# Patient Record
Sex: Male | Born: 1970 | Race: White | Hispanic: No | Marital: Single | Smoking: Never smoker
Health system: Southern US, Community
[De-identification: ages and names within clinical notes are randomized; demographics above are authoritative.]

## PROBLEM LIST (undated history)

## (undated) DIAGNOSIS — T887XXA Unspecified adverse effect of drug or medicament, initial encounter: Secondary | ICD-10-CM

## (undated) DIAGNOSIS — F419 Anxiety disorder, unspecified: Secondary | ICD-10-CM

## (undated) DIAGNOSIS — Z0000 Encounter for general adult medical examination without abnormal findings: Secondary | ICD-10-CM

## (undated) DIAGNOSIS — E669 Obesity, unspecified: Secondary | ICD-10-CM

## (undated) DIAGNOSIS — Z789 Other specified health status: Secondary | ICD-10-CM

## (undated) DIAGNOSIS — R079 Chest pain, unspecified: Secondary | ICD-10-CM

## (undated) DIAGNOSIS — K1370 Unspecified lesions of oral mucosa: Secondary | ICD-10-CM

## (undated) DIAGNOSIS — F32A Depression, unspecified: Secondary | ICD-10-CM

## (undated) DIAGNOSIS — G4700 Insomnia, unspecified: Secondary | ICD-10-CM

## (undated) DIAGNOSIS — T7840XA Allergy, unspecified, initial encounter: Secondary | ICD-10-CM

## (undated) HISTORY — DX: Obesity, unspecified: E66.9

## (undated) HISTORY — DX: Unspecified lesions of oral mucosa: K13.70

## (undated) HISTORY — DX: Unspecified adverse effect of drug or medicament, initial encounter: T88.7XXA

## (undated) HISTORY — DX: Allergy, unspecified, initial encounter: T78.40XA

## (undated) HISTORY — DX: Anxiety disorder, unspecified: F41.9

## (undated) HISTORY — PX: WISDOM TOOTH EXTRACTION: SHX21

## (undated) HISTORY — DX: Encounter for general adult medical examination without abnormal findings: Z00.00

## (undated) HISTORY — DX: Chest pain, unspecified: R07.9

## (undated) HISTORY — DX: Other specified health status: Z78.9

## (undated) HISTORY — DX: Insomnia, unspecified: G47.00

---

## 2012-03-12 ENCOUNTER — Encounter: Payer: Self-pay | Admitting: Family Medicine

## 2012-03-12 ENCOUNTER — Ambulatory Visit (INDEPENDENT_AMBULATORY_CARE_PROVIDER_SITE_OTHER): Payer: BC Managed Care – PPO | Admitting: Family Medicine

## 2012-03-12 VITALS — BP 137/78 | HR 73 | Temp 98.4°F | Ht 72.0 in | Wt 210.6 lb

## 2012-03-12 DIAGNOSIS — Z720 Tobacco use: Secondary | ICD-10-CM | POA: Insufficient documentation

## 2012-03-12 DIAGNOSIS — K1370 Unspecified lesions of oral mucosa: Secondary | ICD-10-CM

## 2012-03-12 DIAGNOSIS — F17200 Nicotine dependence, unspecified, uncomplicated: Secondary | ICD-10-CM

## 2012-03-12 HISTORY — DX: Unspecified lesions of oral mucosa: K13.70

## 2012-03-12 HISTORY — DX: Tobacco use: Z72.0

## 2012-03-12 MED ORDER — NICOTINE POLACRILEX 4 MG MT GUM: 4.0000 mg | CHEWING_GUM | OROMUCOSAL | Status: AC | PRN

## 2012-03-12 NOTE — Progress Notes (Signed)
  Subjective:    Patient ID: Kyle Nolan, male    DOB: 1970-12-15, 41 y.o.   MRN: [id number]  HPI New to establish.  Previous MD- none.  Snuff use- using x22 yrs, used this to quit smoking.  Using a can/day.  Has lesion in the mouth.  Has not seen dentist.  L cheek wall, will occasionally bleed.  This is side that he puts his dip.  Very anxious.  Fearful of mouth cancer.   Review of Systems For ROS see HPI     Objective:   Physical Exam  Vitals reviewed. Constitutional: He appears well-developed and well-nourished. No distress.  HENT:  Head: Normocephalic and atraumatic.  Mouth/Throat: Oropharynx is clear and moist. No oropharyngeal exudate.       Area of abnormality on L buccal mucosa at level of bite line.  No exudate or bleeding today  Psychiatric:       Very anxious Bouncing leg throughout visit          Assessment & Plan:

## 2012-03-12 NOTE — Patient Instructions (Addendum)
Schedule your complete physical at your convenience We'll call you with your ENT appt Chew gum, candy, use lollipops, etc to avoid dipping Use the Nicotine gum if you find yourself needing the Nicotine Call with any questions or concerns Hang in there!

## 2012-03-13 NOTE — Assessment & Plan Note (Signed)
New.  Discussed that this may be due to grinding his teeth on that side and getting cheek caught but due to hx of snuff use will need ENT exam and possible bx.  Pt aware and in agreement.  Will follow.

## 2012-03-13 NOTE — Assessment & Plan Note (Signed)
New.  Pt interested in quitting.  Discussed nicotine replacement vs nicotine receptor blockers.  Pt not interested in systemic tx.  Will do nicorette gum if finds himself going through nicotine w/drawal.  Will otherwise curb his oral fixation w/ lollipops, candy, gum.  Stressed the importance of this.  Pt aware.  Will follow.

## 2012-05-14 ENCOUNTER — Encounter: Payer: BC Managed Care – PPO | Admitting: Family Medicine

## 2013-06-20 ENCOUNTER — Encounter: Payer: Self-pay | Admitting: Family Medicine

## 2013-06-20 ENCOUNTER — Ambulatory Visit (INDEPENDENT_AMBULATORY_CARE_PROVIDER_SITE_OTHER): Payer: BC Managed Care – PPO | Admitting: Family Medicine

## 2013-06-20 VITALS — BP 118/80 | HR 78 | Temp 98.6°F | Ht 71.0 in | Wt 206.6 lb

## 2013-06-20 DIAGNOSIS — F419 Anxiety disorder, unspecified: Secondary | ICD-10-CM | POA: Insufficient documentation

## 2013-06-20 DIAGNOSIS — G4700 Insomnia, unspecified: Secondary | ICD-10-CM

## 2013-06-20 DIAGNOSIS — F341 Dysthymic disorder: Secondary | ICD-10-CM

## 2013-06-20 DIAGNOSIS — R079 Chest pain, unspecified: Secondary | ICD-10-CM | POA: Insufficient documentation

## 2013-06-20 DIAGNOSIS — F329 Major depressive disorder, single episode, unspecified: Secondary | ICD-10-CM

## 2013-06-20 HISTORY — DX: Insomnia, unspecified: G47.00

## 2013-06-20 HISTORY — DX: Anxiety disorder, unspecified: F41.9

## 2013-06-20 HISTORY — DX: Chest pain, unspecified: R07.9

## 2013-06-20 MED ORDER — TRAZODONE HCL 50 MG PO TABS: 25.0000 mg | ORAL_TABLET | Freq: Every evening | ORAL | Status: DC | PRN

## 2013-06-20 NOTE — Progress Notes (Signed)
  Subjective:    Patient ID: Kyle Nolan, male    DOB: 1970-11-08, 42 y.o.   MRN: [id number]  HPI Hospital f/u- was admitted to [facility] on 9/19 w/ CP.  Had cardiac workup and was ruled out.  Had normal MRI of brain, normal stress ECHO, normal carotid dopplers.  Pt reports increased personal stress- ex is attempting to see daughter and daughter (98 yo) attempted suicide b/c she doesn't want to see mom.  Not sleeping x6 weeks.  Pt feels his stress level contributed to his sxs- CP radiating to R shoulder, SOB.  Pain was worse w/ movement.  Now in counseling w/ daughter.  Working w/ a Clinical research associate to ensure no contact w/ mom unless initiated by daughter.   Review of Systems For ROS see HPI     Objective:   Physical Exam  Vitals reviewed. Constitutional: He is oriented to person, place, and time. He appears well-developed and well-nourished. No distress.  HENT:  Head: Normocephalic and atraumatic.  Eyes: Conjunctivae and EOM are normal. Pupils are equal, round, and reactive to light.  Neck: Normal range of motion. Neck supple. No thyromegaly present.  Cardiovascular: Normal rate, regular rhythm, normal heart sounds and intact distal pulses.   No murmur heard. Pulmonary/Chest: Effort normal and breath sounds normal. No respiratory distress.  Abdominal: Soft. Bowel sounds are normal. He exhibits no distension.  Musculoskeletal: He exhibits no edema.  Lymphadenopathy:    He has no cervical adenopathy.  Neurological: He is alert and oriented to person, place, and time. No cranial nerve deficit.  Skin: Skin is warm and dry.  Psychiatric: He has a normal mood and affect. His behavior is normal.          Assessment & Plan:

## 2013-06-20 NOTE — Patient Instructions (Addendum)
Follow up in 4-6 weeks to recheck sleep/mood Start the Trazodone nightly- start w/ 1/2 tab and increase to full tab if needed I'm proud of you for the counseling- this will help! If you reconsider meds, let me know! Call with any questions or concerns Hang in there! Happy Early [place] Ouch!!!

## 2013-06-23 NOTE — Assessment & Plan Note (Addendum)
New.  Pt had complete cardiac w/u and was ruled out for any cardiac events- reviewed hospital record from [facility].  Felt to be stress related.  Reviewed supportive care and red flags that should prompt return.  Pt expressed understanding and is in agreement w/ plan.

## 2013-06-23 NOTE — Assessment & Plan Note (Signed)
New.  Start trazodone and see if improved sleep helps to improve pt's current anxiety/depression.  Reviewed supportive care and red flags that should prompt return.  Pt expressed understanding and is in agreement w/ plan.

## 2013-06-23 NOTE — Assessment & Plan Note (Signed)
New.  Pt admits this is a large problem right now.  Not interested in SSRI at this time but will ask for help if needed.  Will follow closely.

## 2013-07-22 ENCOUNTER — Ambulatory Visit (INDEPENDENT_AMBULATORY_CARE_PROVIDER_SITE_OTHER): Payer: BC Managed Care – PPO | Admitting: Family Medicine

## 2013-07-22 ENCOUNTER — Encounter: Payer: Self-pay | Admitting: Family Medicine

## 2013-07-22 VITALS — BP 130/82 | HR 65 | Temp 98.6°F | Resp 16 | Wt 203.1 lb

## 2013-07-22 DIAGNOSIS — T887XXA Unspecified adverse effect of drug or medicament, initial encounter: Secondary | ICD-10-CM

## 2013-07-22 DIAGNOSIS — F329 Major depressive disorder, single episode, unspecified: Secondary | ICD-10-CM

## 2013-07-22 DIAGNOSIS — G4700 Insomnia, unspecified: Secondary | ICD-10-CM

## 2013-07-22 DIAGNOSIS — T50905A Adverse effect of unspecified drugs, medicaments and biological substances, initial encounter: Secondary | ICD-10-CM

## 2013-07-22 DIAGNOSIS — F341 Dysthymic disorder: Secondary | ICD-10-CM

## 2013-07-22 HISTORY — DX: Unspecified adverse effect of drug or medicament, initial encounter: T88.7XXA

## 2013-07-22 NOTE — Assessment & Plan Note (Signed)
New.  Pt is now having ED from the Trazodone.  Will decrease to 1/2 tab and see if sxs improve.  Pt to call if no improvement.

## 2013-07-22 NOTE — Assessment & Plan Note (Signed)
Improved since starting trazodone.  Decrease to 1/2 tab to see if sleep is still managed but decreased side effects.  Will follow.

## 2013-07-22 NOTE — Progress Notes (Signed)
  Subjective:    Patient ID: Kyle Nolan, male    DOB: Jul 27, 1971, 42 y.o.   MRN: [id number]  HPI Insomnia- improved since starting the trazodone.  Now sleeping 5-5.5 hrs straight.  Medication is causing ED if taken a few hrs prior to activity.  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  Depression- sxs are improving as pt's daughter improves.  No additional suicide attempts, he has come to an agreement w/ his ex-wife.   Review of Systems For ROS see HPI     Objective:   Physical Exam  Vitals reviewed. Constitutional: He is oriented to person, place, and time. He appears well-developed and well-nourished. No distress.  Neurological: He is alert and oriented to person, place, and time.  Psychiatric: He has a normal mood and affect. His behavior is normal. Judgment and thought content normal.          Assessment & Plan:

## 2013-07-22 NOTE — Assessment & Plan Note (Signed)
Improving as pt's daughter's situation/health improves.  Improved sleep has also helped.  Not interested in meds at this time.  Will continue to follow.

## 2013-07-22 NOTE — Patient Instructions (Signed)
Follow up as needed Decrease to 1/2 tab of trazodone nightly If still having side effects, call me! So glad things are improving Happy Halloween!!!

## 2013-10-04 ENCOUNTER — Telehealth: Payer: Self-pay

## 2013-10-04 NOTE — Telephone Encounter (Signed)
Unable to reach. Taken off wait list to replace cancelled appt

## 2013-10-09 ENCOUNTER — Encounter: Payer: Self-pay | Admitting: Family Medicine

## 2013-10-14 ENCOUNTER — Encounter: Payer: BC Managed Care – PPO | Admitting: Family Medicine

## 2013-12-13 ENCOUNTER — Telehealth: Payer: Self-pay

## 2013-12-13 NOTE — Telephone Encounter (Signed)
Left message for call back Non-identifiable   Flu-declined Tdap-

## 2013-12-16 ENCOUNTER — Ambulatory Visit (INDEPENDENT_AMBULATORY_CARE_PROVIDER_SITE_OTHER): Payer: BC Managed Care – PPO | Admitting: Family Medicine

## 2013-12-16 ENCOUNTER — Encounter: Payer: Self-pay | Admitting: Family Medicine

## 2013-12-16 VITALS — BP 116/78 | HR 76 | Temp 98.3°F | Resp 16 | Ht 72.0 in | Wt 216.1 lb

## 2013-12-16 DIAGNOSIS — Z0000 Encounter for general adult medical examination without abnormal findings: Secondary | ICD-10-CM

## 2013-12-16 HISTORY — DX: Encounter for general adult medical examination without abnormal findings: Z00.00

## 2013-12-16 NOTE — Patient Instructions (Signed)
Follow up in 1 year or as needed Keep up the good work! STOP dipping! We'll notify you of your lab results and make any changes if needed Call with any questions or concerns Happy Spring!!!

## 2013-12-16 NOTE — Progress Notes (Signed)
Pre visit review using our clinic review tool, if applicable. No additional management support is needed unless otherwise documented below in the visit note. 

## 2013-12-16 NOTE — Progress Notes (Signed)
   Subjective:    Patient ID: Kyle Nolan, male    DOB: 02-Oct-1970, 43 y.o.   MRN: [id number]  HPI CPE- no concerns.   Review of Systems Patient reports no vision/hearing changes, anorexia, fever ,adenopathy, persistant/recurrent hoarseness, swallowing issues, chest pain, palpitations, edema, persistant/recurrent cough, hemoptysis, dyspnea (rest,exertional, paroxysmal nocturnal), gastrointestinal  bleeding (melena, rectal bleeding), abdominal pain, excessive heart burn, GU symptoms (dysuria, hematuria, voiding/incontinence issues) syncope, focal weakness, memory loss, numbness & tingling, skin/hair/nail changes, depression, anxiety, abnormal bruising/bleeding, musculoskeletal symptoms/signs.     Objective:   Physical Exam General Appearance:    Alert, cooperative, no distress, appears stated age  Head:    Normocephalic, without obvious abnormality, atraumatic  Eyes:    PERRL, conjunctiva/corneas clear, EOM's intact, fundi    benign, both eyes       Ears:    Normal TM's and external ear canals, both ears  Nose:   Nares normal, septum midline, mucosa normal, no drainage   or sinus tenderness  Throat:   Lips, mucosa, and tongue normal; teeth and gums normal  Neck:   Supple, symmetrical, trachea midline, no adenopathy;       thyroid:  No enlargement/tenderness/nodules  Back:     Symmetric, no curvature, ROM normal, no CVA tenderness  Lungs:     Clear to auscultation bilaterally, respirations unlabored  Chest wall:    No tenderness or deformity  Heart:    Regular rate and rhythm, S1 and S2 normal, no murmur, rub   or gallop  Abdomen:     Soft, non-tender, bowel sounds active all four quadrants,    no masses, no organomegaly  Genitalia:    Normal male without lesion, masses,discharge or tenderness  Rectal:    Deferred due to young age  Extremities:   Extremities normal, atraumatic, no cyanosis or edema  Pulses:   2+ and symmetric all extremities  Skin:   Skin color, texture, turgor  normal, no rashes or lesions  Lymph nodes:   Cervical, supraclavicular, and axillary nodes normal  Neurologic:   CNII-XII intact. Normal strength, sensation and reflexes      throughout          Assessment & Plan:

## 2013-12-16 NOTE — Assessment & Plan Note (Signed)
Pt's PE WNL.  Check labs.  Anticipatory guidance provided.  

## 2013-12-17 LAB — CBC WITH DIFFERENTIAL/PLATELET
BASOS ABS: 0 10*3/uL (ref 0.0–0.1)
Basophils Relative: 0.4 % (ref 0.0–3.0)
EOS ABS: 0.1 10*3/uL (ref 0.0–0.7)
Eosinophils Relative: 1.2 % (ref 0.0–5.0)
HCT: 44.5 % (ref 39.0–52.0)
Hemoglobin: 15.2 g/dL (ref 13.0–17.0)
LYMPHS PCT: 18 % (ref 12.0–46.0)
Lymphs Abs: 1.8 10*3/uL (ref 0.7–4.0)
MCHC: 34.1 g/dL (ref 30.0–36.0)
MCV: 92.3 fl (ref 78.0–100.0)
Monocytes Absolute: 0.5 10*3/uL (ref 0.1–1.0)
Monocytes Relative: 4.6 % (ref 3.0–12.0)
NEUTROS PCT: 75.8 % (ref 43.0–77.0)
Neutro Abs: 7.6 10*3/uL (ref 1.4–7.7)
PLATELETS: 272 10*3/uL (ref 150.0–400.0)
RBC: 4.82 Mil/uL (ref 4.22–5.81)
RDW: 12.8 % (ref 11.5–14.6)
WBC: 10.1 10*3/uL (ref 4.5–10.5)

## 2013-12-17 LAB — LIPID PANEL
CHOLESTEROL: 246 mg/dL — AB (ref 0–200)
HDL: 37.7 mg/dL — AB (ref >39.00)
LDL Cholesterol: 142 mg/dL — ABNORMAL HIGH (ref 0–99)
Total CHOL/HDL Ratio: 7
Triglycerides: 330 mg/dL — ABNORMAL HIGH (ref 0.0–149.0)
VLDL: 66 mg/dL — AB (ref 0.0–40.0)

## 2013-12-17 LAB — BASIC METABOLIC PANEL
BUN: 15 mg/dL (ref 6–23)
CO2: 27 meq/L (ref 19–32)
Calcium: 9 mg/dL (ref 8.4–10.5)
Chloride: 103 mEq/L (ref 96–112)
Creatinine, Ser: 1.2 mg/dL (ref 0.4–1.5)
GFR: 72.54 mL/min (ref >60.00)
GLUCOSE: 98 mg/dL (ref 70–99)
POTASSIUM: 3.8 meq/L (ref 3.5–5.1)
Sodium: 138 mEq/L (ref 135–145)

## 2013-12-17 LAB — HEPATIC FUNCTION PANEL
ALBUMIN: 4.3 g/dL (ref 3.5–5.2)
ALK PHOS: 32 U/L — AB (ref 39–117)
ALT: 26 U/L (ref 0–53)
AST: 18 U/L (ref 0–37)
Bilirubin, Direct: 0 mg/dL (ref 0.0–0.3)
Total Bilirubin: 0.7 mg/dL (ref 0.3–1.2)
Total Protein: 7 g/dL (ref 6.0–8.3)

## 2013-12-17 LAB — TSH: TSH: 1.09 u[IU]/mL (ref 0.35–5.50)

## 2013-12-18 ENCOUNTER — Other Ambulatory Visit: Payer: Self-pay | Admitting: Family Medicine

## 2013-12-18 ENCOUNTER — Encounter: Payer: Self-pay | Admitting: General Practice

## 2013-12-18 DIAGNOSIS — E782 Mixed hyperlipidemia: Secondary | ICD-10-CM

## 2013-12-18 NOTE — Telephone Encounter (Signed)
Unable to reach patient pre visit.  

## 2014-03-03 ENCOUNTER — Ambulatory Visit: Payer: Self-pay

## 2014-03-03 ENCOUNTER — Other Ambulatory Visit: Payer: Self-pay | Admitting: Occupational Medicine

## 2014-03-03 DIAGNOSIS — M25511 Pain in right shoulder: Secondary | ICD-10-CM

## 2014-12-22 ENCOUNTER — Encounter: Payer: Self-pay | Admitting: Family Medicine

## 2015-02-16 ENCOUNTER — Encounter: Payer: Self-pay | Admitting: Family Medicine

## 2015-02-16 DIAGNOSIS — Z0289 Encounter for other administrative examinations: Secondary | ICD-10-CM

## 2015-02-26 ENCOUNTER — Encounter: Payer: Self-pay | Admitting: Family Medicine

## 2015-02-26 ENCOUNTER — Telehealth: Payer: Self-pay | Admitting: Family Medicine

## 2015-02-26 NOTE — Telephone Encounter (Signed)
Pt was no show for cpe on 5/23. Letter sent- charge?

## 2015-02-26 NOTE — Telephone Encounter (Signed)
Yes- charge please

## 2015-09-16 IMAGING — CR DG SHOULDER 2+V*R*
3 series · 3 of 3 positions shown · non-contrast
Comparison: None.

CLINICAL DATA: 42-year-old male with lifting injury and persistent
pain with decreased range of motion. Initial encounter.

EXAM:
RIGHT SHOULDER - 2+ VIEW

[view not recorded (1 of 3)]
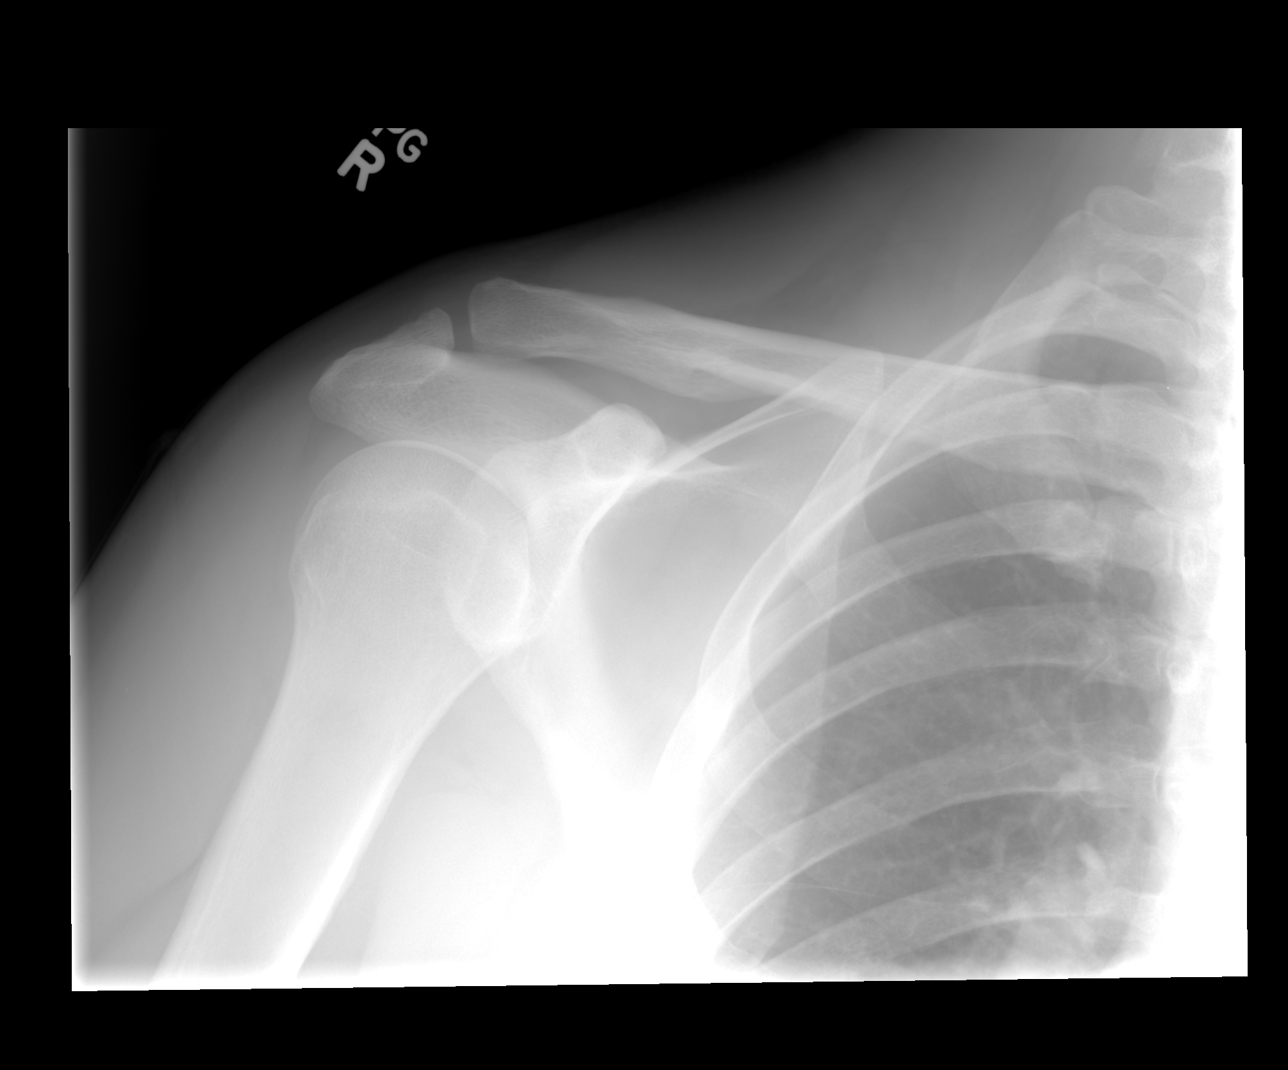

[view not recorded (2 of 3)]
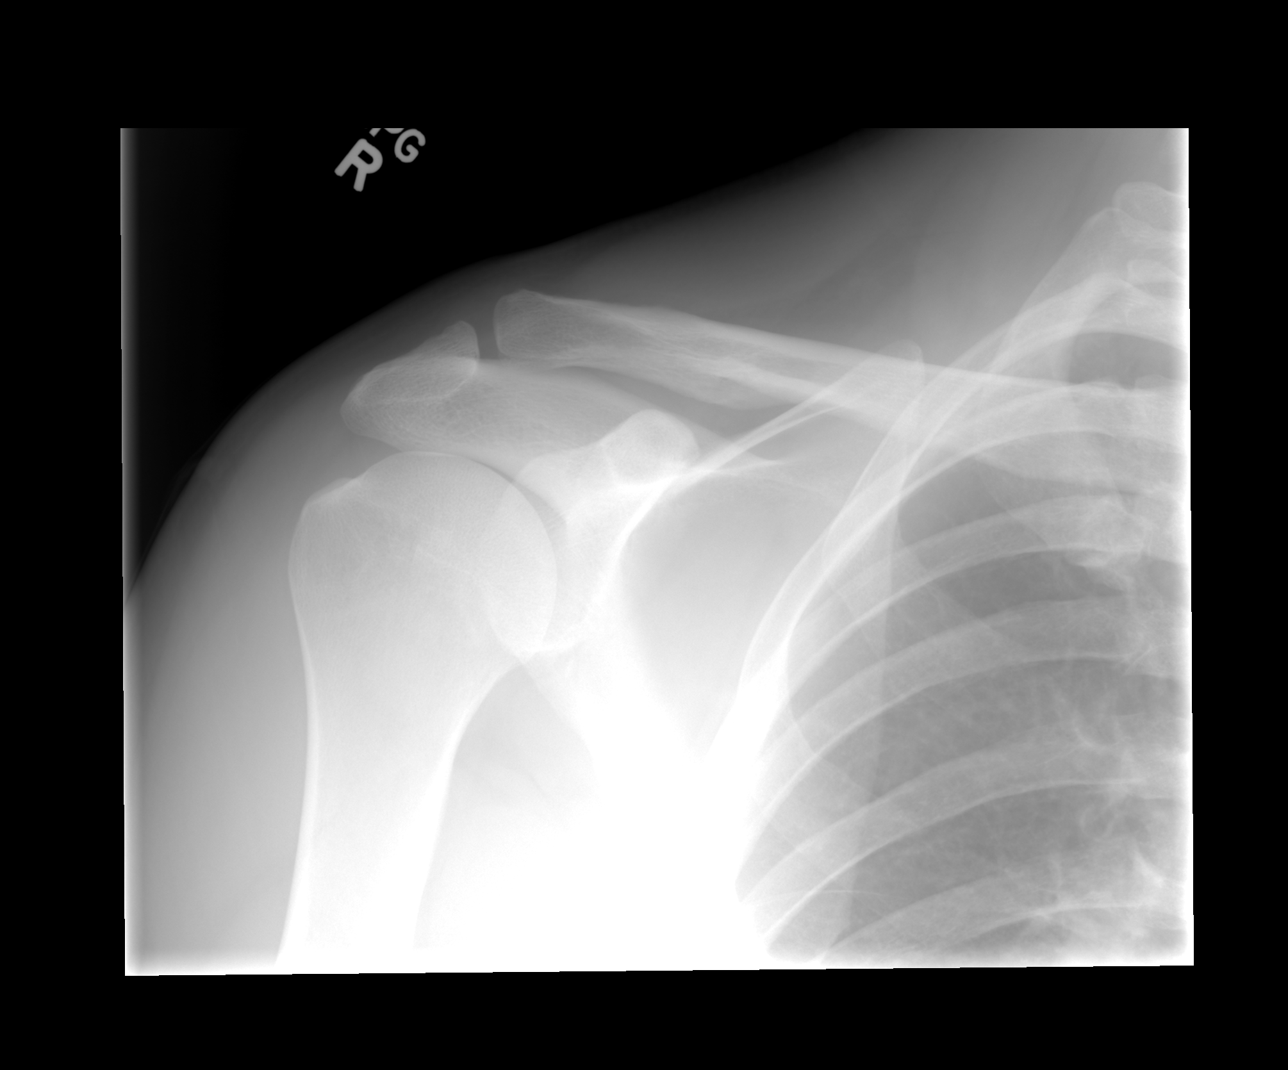

[view not recorded (3 of 3)]
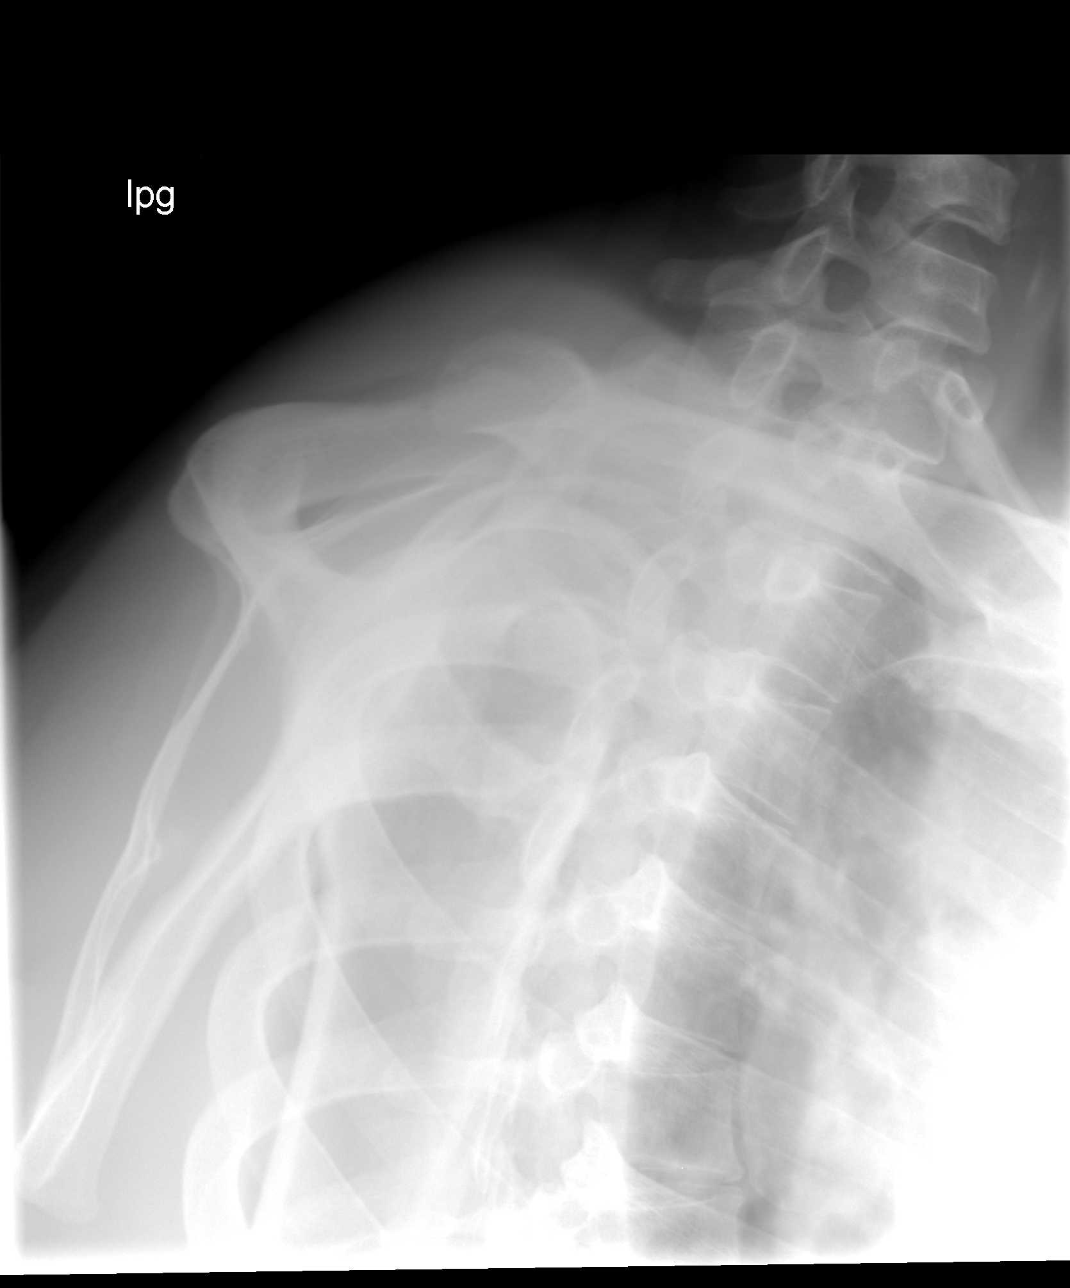

[3 of 3 positions shown; findings below may reference images not displayed]

FINDINGS: Bone mineralization is within normal limits. Preserved proximal
humerus rotation arguing against glenohumeral joint dislocation. The
scapular Y-view positioning is suboptimal. Proximal right humerus
appears intact. Right clavicle and scapula appear intact. Negative
visualized right ribs and lung parenchyma.
IMPRESSION: No acute fracture or dislocation identified about the right
shoulder.

## 2018-02-15 ENCOUNTER — Emergency Department (HOSPITAL_COMMUNITY): Payer: Commercial Managed Care - PPO

## 2018-02-15 ENCOUNTER — Encounter (HOSPITAL_COMMUNITY): Payer: Self-pay | Admitting: Emergency Medicine

## 2018-02-15 ENCOUNTER — Emergency Department (HOSPITAL_COMMUNITY)
Admission: EM | Admit: 2018-02-15 | Discharge: 2018-02-15 | Disposition: A | Discharge status: Home / Self Care | Payer: Commercial Managed Care - PPO | Attending: Emergency Medicine | Admitting: Emergency Medicine

## 2018-02-15 DIAGNOSIS — R079 Chest pain, unspecified: Secondary | ICD-10-CM | POA: Diagnosis not present

## 2018-02-15 DIAGNOSIS — Z79899 Other long term (current) drug therapy: Secondary | ICD-10-CM | POA: Diagnosis not present

## 2018-02-15 DIAGNOSIS — R609 Edema, unspecified: Secondary | ICD-10-CM | POA: Insufficient documentation

## 2018-02-15 DIAGNOSIS — R0789 Other chest pain: Secondary | ICD-10-CM | POA: Diagnosis present

## 2018-02-15 DIAGNOSIS — R0602 Shortness of breath: Secondary | ICD-10-CM | POA: Diagnosis not present

## 2018-02-15 LAB — CBC
HCT: 42.8 % (ref 39.0–52.0)
Hemoglobin: 14.3 g/dL (ref 13.0–17.0)
MCH: 30.2 pg (ref 26.0–34.0)
MCHC: 33.4 g/dL (ref 30.0–36.0)
MCV: 90.5 fL (ref 78.0–100.0)
PLATELETS: 252 10*3/uL (ref 150–400)
RBC: 4.73 MIL/uL (ref 4.22–5.81)
RDW: 12.3 % (ref 11.5–15.5)
WBC: 8.2 10*3/uL (ref 4.0–10.5)

## 2018-02-15 LAB — BASIC METABOLIC PANEL
Anion gap: 8 (ref 5–15)
BUN: 15 mg/dL (ref 6–20)
CO2: 27 mmol/L (ref 22–32)
CREATININE: 1.14 mg/dL (ref 0.61–1.24)
Calcium: 9 mg/dL (ref 8.9–10.3)
Chloride: 105 mmol/L (ref 101–111)
GFR calc Af Amer: >60 mL/min (ref >60)
Glucose, Bld: 111 mg/dL — ABNORMAL HIGH (ref 65–99)
Potassium: 4.5 mmol/L (ref 3.5–5.1)
SODIUM: 140 mmol/L (ref 135–145)

## 2018-02-15 LAB — D-DIMER, QUANTITATIVE: D-Dimer, Quant: <0.27 ug/mL-FEU (ref 0.00–0.50)

## 2018-02-15 LAB — I-STAT TROPONIN, ED
TROPONIN I, POC: 0 ng/mL (ref 0.00–0.08)
TROPONIN I, POC: 0 ng/mL (ref 0.00–0.08)
Troponin i, poc: 0 ng/mL (ref 0.00–0.08)

## 2018-02-15 NOTE — ED Triage Notes (Signed)
Patient to ED c/o intermittent L sided CP x 3 days with SOB and worsening pain upon inspiration. 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 States the pain has lasted as long as 15 minutes at a time and is occurring more frequently and upon rest and exertion. Denies pain at this time. Resp e/u, skin warm/dry.

## 2018-02-15 NOTE — ED Provider Notes (Signed)
[facility] EMERGENCY DEPARTMENT Provider Note   CSN: [id number] Arrival date & time: 02/15/18  6433     History   Chief Complaint Chief Complaint  Patient presents with  . Chest Pain    HPI Kyle Nolan is a 47 y.o. male here presenting with chest pain.  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  Patient states that he has been having intermittent left-sided chest pain and shortness of breath that is worse with inspiration lasted several minutes at a time.  Patient states that the stabbing sensation but denies any radiation to the pain.  Patient noticed some swelling on bilateral ankles but denies any calf pain.  Patient denies any previous blood clots and denies any history of CAD.   The history is provided by the patient.    Past Medical History:  Diagnosis Date  . Allergy     Patient Active Problem List   Diagnosis Date Noted  . Routine general medical examination at a health care facility 12/16/2013  . Medication side effect 07/22/2013  . Anxiety and depression 06/20/2013  . Chest pain, unspecified 06/20/2013  . Insomnia 06/20/2013  . Snuff user 03/12/2012  . Mouth lesion 03/12/2012    History reviewed. No pertinent surgical history.      Home Medications    Prior to Admission medications   Medication Sig Start Date End Date Taking? Authorizing Provider  ibuprofen (ADVIL,MOTRIN) 200 MG tablet Take 800 mg by mouth every 6 (six) hours as needed for mild pain.   Yes [provider]  levocetirizine (XYZAL) 5 MG tablet Take 5 mg by mouth every evening.   Yes [provider]  traZODone (DESYREL) 50 MG tablet Take 0.5-1 tablets (25-50 mg total) by mouth at bedtime as needed for sleep. Patient not taking: Reported on 02/15/2018 06/20/13   Midge Minium, MD    Family History Family History  Problem Relation Age of Onset  . Hyperlipidemia Mother   . Hypertension Mother   . Hyperlipidemia Father   .  Hypertension Father   . Depression Father   . OCD Father   . Cancer Maternal Uncle   . Hyperlipidemia Maternal Grandmother   . Hypertension Maternal Grandmother   . Cancer Maternal Grandfather   . Hyperlipidemia Maternal Grandfather   . Hypertension Maternal Grandfather   . Cancer Paternal Grandmother   . Hyperlipidemia Paternal Grandmother   . Hypertension Paternal Grandmother   . Cancer Paternal Grandfather   . Hyperlipidemia Paternal Grandfather   . Hypertension Paternal Grandfather   . Diabetes Paternal Grandfather     Social History Social History   Tobacco Use  . Smoking status: Never Smoker  . Smokeless tobacco: Never Used  Substance Use Topics  . Alcohol use: Yes    Comment: occasionally  . Drug use: No     Allergies   Patient has no known allergies.   Review of Systems Review of Systems  Cardiovascular: Positive for chest pain.  All other systems reviewed and are negative.    Physical Exam Updated Vital Signs BP (!) 124/91   Pulse (!) 57   Temp 98 F (36.7 C) (Oral)   Resp 15   SpO2 97%   Physical Exam  Constitutional: He appears well-developed and well-nourished.  HENT:  Head: Normocephalic.  Eyes: Pupils are equal, round, and reactive to light.  Neck: Normal range of motion.  Cardiovascular: Regular rhythm and normal pulses.  Pulmonary/Chest: Effort normal and breath sounds  normal.  No reproducible tenderness   Abdominal: Soft. Bowel sounds are normal.  Musculoskeletal: Normal range of motion.       Right lower leg: Normal.       Left lower leg: Normal.  Trace edema bilaterally, no calf tenderness   Neurological: He is alert.  Skin: Skin is warm. Capillary refill takes less than 2 seconds.  Psychiatric: He has a normal mood and affect. His behavior is normal.  Nursing note and vitals reviewed.    ED Treatments / Results  Labs (all labs ordered are listed, but only abnormal results are displayed) Labs Reviewed  BASIC METABOLIC  PANEL - Abnormal; Notable for the following components:      Result Value   Glucose, Bld 111 (*)    All other components within normal limits  CBC  D-DIMER, QUANTITATIVE (NOT AT [facility])  I-STAT TROPONIN, ED  I-STAT TROPONIN, ED  I-STAT TROPONIN, ED    EKG EKG Interpretation  Date/Time:  Thursday Feb 15 2018 09:17:35 EDT Ventricular Rate:  63 PR Interval:  142 QRS Duration: 88 QT Interval:  368 QTC Calculation: 376 R Axis:   43 Text Interpretation:  Normal sinus rhythm Normal ECG No previous ECGs available Confirmed by Wandra Arthurs [phone]) on 02/15/2018 12:12:03 PM   Radiology Dg Chest 2 View  Result Date: 02/15/2018 CLINICAL DATA:  Left-sided chest pain intermittently over the last 3-4 days, some shortness of breath EXAM: CHEST - 2 VIEW COMPARISON:  None. FINDINGS: No active infiltrate or effusion is seen. Mediastinal and hilar contours are unremarkable. The heart is within normal limits in size. No bony abnormality is seen. IMPRESSION: No active cardiopulmonary disease. Electronically Signed   By: Ivar Drape M.D.   On: 02/15/2018 09:45    Procedures Procedures (including critical care time)  Medications Ordered in ED Medications - No data to display   Initial Impression / Assessment and Plan / ED Course  I have reviewed the triage vital signs and the nursing notes.  Pertinent labs & imaging results that were available during my care of the patient were reviewed by me and considered in my medical decision making (see chart for details).     Kyle Nolan is a 47 y.o. male here with chest pain, SOB. Vitals stable, not tachycardic. 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 Will get labs, trop x 2, CXR.    1:54 PM Labs unremarkable. D-dimer neg. Trop neg x 2. Stable for discharge. Recommend stress test, cardiology follow up if he still has chest pain.   Final Clinical Impressions(s) / ED Diagnoses   Final diagnoses:  None    ED  Discharge Orders    None       Drenda Freeze, MD 02/15/18 1354

## 2018-02-15 NOTE — Discharge Instructions (Signed)
Take tylenol, motrin for pain.   See cardiology for stress test if you have persistent pain for a week   Return to ER if you have worse chest pain, trouble breathing, shortness of breath

## 2018-02-22 ENCOUNTER — Telehealth: Payer: Self-pay | Admitting: Family Medicine

## 2018-02-22 NOTE — Telephone Encounter (Signed)
Copied from [place] [phone]. Topic: Quick Communication - See Telephone Encounter >> Feb 22, 2018  8:54 AM Cleaster Corin, NT wrote: CRM for notification. See Telephone encounter for: 02/22/18.  Pt.would like to re-establish care with Dr. Birdie Riddle pt. Hasn't been seen in office since 12/16/13

## 2018-02-22 NOTE — Telephone Encounter (Signed)
LMOVM for pt to call back and schedule appointment to re-establish care.

## 2018-02-22 NOTE — Telephone Encounter (Signed)
Ok to re-establish 

## 2018-03-02 ENCOUNTER — Other Ambulatory Visit: Payer: Self-pay

## 2018-03-02 ENCOUNTER — Ambulatory Visit: Payer: Commercial Managed Care - PPO | Admitting: Family Medicine

## 2018-03-02 ENCOUNTER — Encounter: Payer: Self-pay | Admitting: Family Medicine

## 2018-03-02 VITALS — BP 118/80 | HR 68 | Temp 97.5°F | Resp 16 | Ht 71.0 in | Wt 219.8 lb

## 2018-03-02 DIAGNOSIS — R079 Chest pain, unspecified: Secondary | ICD-10-CM

## 2018-03-02 DIAGNOSIS — E663 Overweight: Secondary | ICD-10-CM | POA: Insufficient documentation

## 2018-03-02 DIAGNOSIS — E669 Obesity, unspecified: Secondary | ICD-10-CM | POA: Diagnosis not present

## 2018-03-02 DIAGNOSIS — H6982 Other specified disorders of Eustachian tube, left ear: Secondary | ICD-10-CM | POA: Diagnosis not present

## 2018-03-02 DIAGNOSIS — F419 Anxiety disorder, unspecified: Secondary | ICD-10-CM

## 2018-03-02 HISTORY — DX: Obesity, unspecified: E66.9

## 2018-03-02 LAB — LIPID PANEL
CHOLESTEROL: 200 mg/dL (ref 0–200)
HDL: 28 mg/dL — ABNORMAL LOW (ref >39.00)
NonHDL: 172.02
Total CHOL/HDL Ratio: 7
Triglycerides: 247 mg/dL — ABNORMAL HIGH (ref 0.0–149.0)
VLDL: 49.4 mg/dL — ABNORMAL HIGH (ref 0.0–40.0)

## 2018-03-02 LAB — CBC WITH DIFFERENTIAL/PLATELET
BASOS ABS: 0.1 10*3/uL (ref 0.0–0.1)
Basophils Relative: 1.8 % (ref 0.0–3.0)
EOS ABS: 0.3 10*3/uL (ref 0.0–0.7)
Eosinophils Relative: 3.8 % (ref 0.0–5.0)
HCT: 43.4 % (ref 39.0–52.0)
Hemoglobin: 14.9 g/dL (ref 13.0–17.0)
LYMPHS ABS: 3 10*3/uL (ref 0.7–4.0)
Lymphocytes Relative: 41 % (ref 12.0–46.0)
MCHC: 34.4 g/dL (ref 30.0–36.0)
MCV: 87.9 fl (ref 78.0–100.0)
MONO ABS: 0.5 10*3/uL (ref 0.1–1.0)
Monocytes Relative: 6.8 % (ref 3.0–12.0)
NEUTROS PCT: 46.6 % (ref 43.0–77.0)
Neutro Abs: 3.5 10*3/uL (ref 1.4–7.7)
Platelets: 282 10*3/uL (ref 150.0–400.0)
RBC: 4.94 Mil/uL (ref 4.22–5.81)
RDW: 13.4 % (ref 11.5–15.5)
WBC: 7.4 10*3/uL (ref 4.0–10.5)

## 2018-03-02 LAB — BASIC METABOLIC PANEL
BUN: 12 mg/dL (ref 6–23)
CHLORIDE: 103 meq/L (ref 96–112)
CO2: 29 mEq/L (ref 19–32)
Calcium: 9.6 mg/dL (ref 8.4–10.5)
Creatinine, Ser: 1.18 mg/dL (ref 0.40–1.50)
GFR: 69.83 mL/min (ref >60.00)
GLUCOSE: 112 mg/dL — AB (ref 70–99)
POTASSIUM: 4.1 meq/L (ref 3.5–5.1)
Sodium: 139 mEq/L (ref 135–145)

## 2018-03-02 LAB — HEPATIC FUNCTION PANEL
ALBUMIN: 4.4 g/dL (ref 3.5–5.2)
ALK PHOS: 35 U/L — AB (ref 39–117)
ALT: 40 U/L (ref 0–53)
AST: 22 U/L (ref 0–37)
Bilirubin, Direct: 0.1 mg/dL (ref 0.0–0.3)
TOTAL PROTEIN: 7.2 g/dL (ref 6.0–8.3)
Total Bilirubin: 0.5 mg/dL (ref 0.2–1.2)

## 2018-03-02 LAB — TSH: TSH: 1.86 u[IU]/mL (ref 0.35–4.50)

## 2018-03-02 LAB — LDL CHOLESTEROL, DIRECT: LDL DIRECT: 130 mg/dL

## 2018-03-02 MED ORDER — FLUTICASONE PROPIONATE 50 MCG/ACT NA SUSP: 2.0000 | Freq: Every day | NASAL | 6 refills | Status: DC

## 2018-03-02 MED ORDER — FLUOXETINE HCL 10 MG PO TABS: 10.0000 mg | ORAL_TABLET | Freq: Every day | ORAL | 3 refills | Status: DC

## 2018-03-02 NOTE — Assessment & Plan Note (Signed)
Pt was seen in ER on 5/23 for chest pain but work up was WNL.  Pt continues to have stabbing pains when stressed.  Will start tx for anxiety and see if sxs improve.  Pt expressed understanding and is in agreement w/ plan.

## 2018-03-02 NOTE — Assessment & Plan Note (Signed)
New to provider.  Stressed need for healthy diet and regular exercise- both of which are difficult w/ his job.  Check labs to risk stratify.  Will follow.

## 2018-03-02 NOTE — Patient Instructions (Signed)
Follow up in 4-5 weeks to recheck anxiety We'll notify you of your lab results and make any changes if needed Continue to work on healthy diet and regular exercise- you can do it!  It's also good for stress relief START the Fluoxetine daily for anxiety USE the Flonase- 2 sprays each nostril daily- to improve your ear pressure Call with any questions or concerns Hang in there!!  You can do this!

## 2018-03-02 NOTE — Assessment & Plan Note (Signed)
Deteriorated.  Pt again ended up in ER for CP that ER felt was stress related.  Pt is open to idea of starting medication.  He does not feel that he has a component of depression but PHQ 9 score=8.  Will start Prozac 10mg  daily and increase if needed.  Pt expressed understanding and is in agreement w/ plan.

## 2018-03-02 NOTE — Progress Notes (Signed)
   Subjective:    Patient ID: Kyle Nolan, male    DOB: November 29, 1970, 47 y.o.   MRN: [id number]  HPI Pt to re-establish.  Has not been seen in 4 yrs.  Anxiety- pt has hx of this years ago.  This manifest itself as CP back in 2014 and had complete cardiac workup at that time.  + SOB- will find himself needing to go outside.  Will start sweating.  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  Denies depressive sxs.  CP- pt was seen on 5/23 in ER for L sided stabbing CP.  EKG unremarkable, labs including Ddimer and troponin (-) x2.  Continues to have intermittent, short lived, stabbing pains.    Obesity- pt's BMI is 30.7.  Reports poor eating and sleeping since switching to night shift.  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  L ear pressure- sxs started Saturday night.  Pt reports hearing loss on L side.  Has been putting water in ear w/o relief.   Review of Systems For ROS see HPI     Objective:   Physical Exam  Constitutional: He appears well-developed and well-nourished. No distress.  HENT:  Head: Normocephalic and atraumatic.  No TTP over sinuses + turbinate edema + PND L TM retracted  Eyes: Pupils are equal, round, and reactive to light. Conjunctivae and EOM are normal.  Neck: Normal range of motion. Neck supple.  Cardiovascular: Normal rate, regular rhythm and normal heart sounds.  Pulmonary/Chest: Effort normal and breath sounds normal. No respiratory distress. He has no wheezes.  Lymphadenopathy:    He has no cervical adenopathy.  Skin: Skin is warm and dry.  Vitals reviewed.         Assessment & Plan:

## 2018-03-20 ENCOUNTER — Ambulatory Visit: Payer: Commercial Managed Care - PPO | Admitting: Internal Medicine

## 2018-03-21 ENCOUNTER — Encounter: Payer: Self-pay | Admitting: Internal Medicine

## 2018-04-09 ENCOUNTER — Other Ambulatory Visit: Payer: Self-pay

## 2018-04-09 ENCOUNTER — Ambulatory Visit: Payer: Commercial Managed Care - PPO | Admitting: Family Medicine

## 2018-04-09 ENCOUNTER — Encounter: Payer: Self-pay | Admitting: Family Medicine

## 2018-04-09 VITALS — BP 121/81 | HR 82 | Temp 98.7°F | Resp 16 | Ht 71.0 in | Wt 212.2 lb

## 2018-04-09 DIAGNOSIS — F419 Anxiety disorder, unspecified: Secondary | ICD-10-CM

## 2018-04-09 MED ORDER — FLUOXETINE HCL 20 MG PO TABS: 20.0000 mg | ORAL_TABLET | Freq: Every day | ORAL | 3 refills | Status: DC

## 2018-04-09 NOTE — Assessment & Plan Note (Signed)
Improved since pt started Fluoxetine at last visit.  Pt reports he is sleeping much better since starting medication.  Pt notes a difference but fiance does not.  No side effects.  Increase to 20mg  daily.  Will follow.

## 2018-04-09 NOTE — Patient Instructions (Signed)
Follow up by phone or MyChart in 1 month to tell me how the increased dose is working INCREASE to 20mg  daily- 2 of what you have at home and 1 of the new prescription Call with any questions or concerns Enjoy the rest of your summer!!!

## 2018-04-09 NOTE — Progress Notes (Signed)
   Subjective:    Patient ID: Kyle Nolan, male    DOB: 04-May-1971, 46 y.o.   MRN: [id number]  HPI Anxiety- started Fluoxetine 10mg  at last visit.  Pt reports he can feel a difference after taking the medication but fiance doesn't notice yet.  Pt not having any side effects from medication.  Fiance hasn't specifically reported any behavior concerns.  Pt reports he is sleeping better- 'a good 5-6 hrs now'.   Review of Systems For ROS see HPI     Objective:   Physical Exam  Constitutional: He is oriented to person, place, and time. He appears well-developed and well-nourished. No distress.  HENT:  Head: Normocephalic and atraumatic.  Neurological: He is alert and oriented to person, place, and time. He displays normal reflexes. No cranial nerve deficit. Coordination normal.  Skin: Skin is warm and dry.  Psychiatric: He has a normal mood and affect. His behavior is normal. Thought content normal.  Vitals reviewed.         Assessment & Plan:

## 2018-08-23 ENCOUNTER — Other Ambulatory Visit: Payer: Self-pay | Admitting: Family Medicine

## 2018-11-22 ENCOUNTER — Encounter: Payer: Self-pay | Admitting: Family Medicine

## 2018-11-23 ENCOUNTER — Other Ambulatory Visit: Payer: Self-pay | Admitting: Family Medicine

## 2018-11-23 NOTE — Telephone Encounter (Signed)
Patient is coming in on Monday to be seen for anxiety.  I spoke with him and he has enough medication to get him through the weekend, so we are going to hold off on refills until he is seen in case changes are made.  Patient agreed with this plan and will be here on Monday for his appointment.

## 2018-11-26 ENCOUNTER — Encounter: Payer: Self-pay | Admitting: Physician Assistant

## 2018-11-26 ENCOUNTER — Ambulatory Visit: Payer: Commercial Managed Care - PPO | Admitting: Physician Assistant

## 2018-11-26 ENCOUNTER — Other Ambulatory Visit: Payer: Self-pay

## 2018-11-26 VITALS — BP 130/86 | HR 74 | Temp 98.5°F | Resp 16 | Ht 71.0 in | Wt 208.0 lb

## 2018-11-26 DIAGNOSIS — F419 Anxiety disorder, unspecified: Secondary | ICD-10-CM

## 2018-11-26 DIAGNOSIS — G4700 Insomnia, unspecified: Secondary | ICD-10-CM | POA: Diagnosis not present

## 2018-11-26 MED ORDER — FLUOXETINE HCL 40 MG PO CAPS: 40.0000 mg | ORAL_CAPSULE | Freq: Every day | ORAL | 3 refills | Status: DC

## 2018-11-26 NOTE — Patient Instructions (Signed)
Please start the new dose of the Fluoxetine daily. Download the Calm or the [employer] app on the phone to see if this helps you to develop some coping mechanisms for anxiety.  Consider the counseling.   Follow-up 4-6 weeks for reassessment.  Sleep Hygiene  Do: (1) Go to bed at the same time each day. (2) Get up from bed at the same time each day. (3) Get regular exercise each day, preferably in the morning.  There is goof evidence that regular exercise improves restful sleep.  This includes stretching and aerobic exercise. (4) Get regular exposure to outdoor or bright lights, especially in the late afternoon. (5) Keep the temperature in your bedroom comfortable. (6) Keep the bedroom quiet when sleeping. (7) Keep the bedroom dark enough to facilitate sleep. (8) Use your bed only for sleep and sex. (9) Take medications as directed.  It is helpful to take prescribed sleeping pills 1 hour before bedtime, so they are causing drowsiness when you lie down, or 10 hours before getting up, to avoid daytime drowsiness. (10) Use a relaxation exercise just before going to sleep -- imagery, massage, warm bath. (11) Keep your feet and hands warm.  Wear warm socks and/or mittens or gloves to bed.  Don't: (1) Exercise just before going to bed. (2) Engage in stimulating activity just before bed, such as playing a competitive game, watching an exciting program on television, or having an important discussion with a loved one. (3) Have caffeine in the evening (coffee, teas, chocolate, sodas, etc.) (4) Read or watch television in bed. (5) Use alcohol to help you sleep. (6) Go to bed too hungry or too full. (7) Take another person's sleeping pills. (8) Take over-the-counter sleeping pills, without your doctor's knowledge.  Tolerance can develop rapidly with these medications.  Diphenhydramine can have serious side effects for elderly patients. (9) Take daytime naps. (10) Command yourself to go to sleep.   This only makes your mind and body more alert.  If you lie awake for more than 20-30 minutes, get up, go to a different room, participate in a quiet activity (Ex - non-excitable reading or television), and then return to bed when you feel sleepy.  Do this as many times during the night as needed.  This may cause you to have a night or two of poor sleep but it will train your brain to know when it is time for sleep.

## 2018-11-26 NOTE — Progress Notes (Signed)
Patient presents to clinic today c/o deterioration in anxiety. Is currently on a regimen of Fluoxetine 20 mg daily. Endorses taking daily as directed. Notes it was working well but over the past few months noting increase in anxiety and irritability. Also notes some fluctuation in sleep. Initially felt that this was related to holiday stressors but these have calmed down and symptoms are still present. In terms of sleep, patient notes falling asleep ok but then will only sleep for 3 hours. Then wakes up and feels mind racing. Takes an hour or so to go back to sleep. Feels sleep is very disjointed and non-restorative. Denies depressed mood or anhedonia. Denies SI.Takes Fluoxetine in the evening.   Past Medical History:  Diagnosis Date  . Allergy   . Anxiety 06/20/2013  . Chest pain, unspecified 06/20/2013  . Insomnia 06/20/2013  . Medication side effect 07/22/2013  . Mouth lesion 03/12/2012  . Obesity (BMI 30.0-34.9) 03/02/2018  . Routine general medical examination at a health care facility 12/16/2013  . Snuff user 03/12/2012    Current Outpatient Medications on File Prior to Visit  Medication Sig Dispense Refill  . FLUoxetine (PROZAC) 20 MG tablet TAKE ONE TABLET BY MOUTH DAILY 30 tablet 2  . fluticasone (FLONASE) 50 MCG/ACT nasal spray Place 2 sprays into both nostrils daily. 16 g 6   No current facility-administered medications on file prior to visit.     No Known Allergies  Family History  Problem Relation Age of Onset  . Hyperlipidemia Mother   . Hypertension Mother   . Hyperlipidemia Father   . Hypertension Father   . Depression Father   . OCD Father   . Cancer Maternal Uncle   . Hyperlipidemia Maternal Grandmother   . Hypertension Maternal Grandmother   . Cancer Maternal Grandfather   . Hyperlipidemia Maternal Grandfather   . Hypertension Maternal Grandfather   . Cancer Paternal Grandmother   . Hyperlipidemia Paternal Grandmother   . Hypertension Paternal Grandmother   .  Cancer Paternal Grandfather   . Hyperlipidemia Paternal Grandfather   . Hypertension Paternal Grandfather   . Diabetes Paternal Grandfather     Social History   Socioeconomic History  . Marital status: Single    Spouse name: Not on file  . Number of children: Not on file  . Years of education: Not on file  . Highest education level: Not on file  Occupational History  . Not on file  Social Needs  . Financial resource strain: Not on file  . Food insecurity:    Worry: Not on file    Inability: Not on file  . Transportation needs:    Medical: Not on file    Non-medical: Not on file  Tobacco Use  . Smoking status: Never Smoker  . Smokeless tobacco: Never Used  Substance and Sexual Activity  . Alcohol use: Yes    Comment: occasionally  . Drug use: No  . Sexual activity: Yes  Lifestyle  . Physical activity:    Days per week: Not on file    Minutes per session: Not on file  . Stress: Not on file  Relationships  . Social connections:    Talks on phone: Not on file    Gets together: Not on file    Attends religious service: Not on file    Active member of club or organization: Not on file    Attends meetings of clubs or organizations: Not on file    Relationship status: Not on file  Other Topics Concern  . Not on file  Social History Narrative  . Not on file   Review of Systems - See HPI.  All other ROS are negative.  BP 130/86   Pulse 74   Temp 98.5 F (36.9 C) (Oral)   Resp 16   Ht 5\' 11"  (1.803 m)   Wt 208 lb (94.3 kg)   SpO2 98%   BMI 29.01 kg/m   Physical Exam Vitals signs reviewed.  Constitutional:      Appearance: Normal appearance.  HENT:     Head: Normocephalic and atraumatic.  Neck:     Musculoskeletal: Neck supple.  Cardiovascular:     Rate and Rhythm: Normal rate and regular rhythm.     Pulses: Normal pulses.     Heart sounds: Normal heart sounds.  Pulmonary:     Effort: Pulmonary effort is normal.     Breath sounds: Normal breath sounds.   Neurological:     Mental Status: He is alert.  Psychiatric:        Mood and Affect: Mood normal.      Assessment/Plan: 1. Anxiety 2. Insomnia, unspecified type Increase Fluoxetine to 40 mg daily. Keep active. Sleep Hygiene measures reviewed and handout given. Hopeful Fluoxetine will also help with sleep. Recommended he download the Calm or [place] application on his phone to work on mindfulness training. Declines counseling at present but handout given in case he changes his mind. Follow-up with PCP in 4-6 weeks.    Leeanne Rio, PA-C

## 2018-11-30 ENCOUNTER — Ambulatory Visit: Payer: Self-pay | Admitting: Family Medicine

## 2018-12-24 ENCOUNTER — Ambulatory Visit (INDEPENDENT_AMBULATORY_CARE_PROVIDER_SITE_OTHER): Payer: Commercial Managed Care - PPO | Admitting: Family Medicine

## 2018-12-24 ENCOUNTER — Other Ambulatory Visit: Payer: Self-pay

## 2018-12-24 ENCOUNTER — Ambulatory Visit: Payer: Self-pay | Admitting: Family Medicine

## 2018-12-24 ENCOUNTER — Encounter: Payer: Self-pay | Admitting: Family Medicine

## 2018-12-24 VITALS — Ht 71.0 in | Wt 200.0 lb

## 2018-12-24 DIAGNOSIS — F419 Anxiety disorder, unspecified: Secondary | ICD-10-CM | POA: Diagnosis not present

## 2018-12-24 MED ORDER — BUSPIRONE HCL 7.5 MG PO TABS: 7.5000 mg | ORAL_TABLET | Freq: Two times a day (BID) | ORAL | 3 refills | Status: DC

## 2018-12-24 MED ORDER — ALPRAZOLAM 0.5 MG PO TABS: ORAL_TABLET | ORAL | 0 refills | Status: DC

## 2018-12-24 NOTE — Progress Notes (Signed)
Virtual Visit via Video Note  I connected with Kyle Nolan on 12/24/18 at 10:00 AM EDT by a video enabled telemedicine application and verified that I am speaking with the correct person using two identifiers.   I discussed the limitations of evaluation and management by telemedicine and the availability of in person appointments. The patient expressed understanding and agreed to proceed.  Pt is at home and I am in the office  History of Present Illness: Anxiety- pt's Fluoxetine was increased to 40mg  on 3/2 when he saw Einar Pheasant due to worsening anxiety.  He reports sxs have worsened.  Pt reports he is 'freaking out' about what's going on.  He is trying to avoid news and social media.  Wife is a Dietitian, he is essential- both are working and he is fearful of bringing the virus home to children.  Pt reports he will start sweating, + palpitations.   Observations/Objective: AAOx3 Pt is holding back tears for most of the visit Coloring WNL Pt is able to speak clearly, coherently without shortness of breath or increased work of breathing.  Thought process is linear. Very anxious  Assessment and Plan: Anxiety- Deteriorated.  Pt is very distraught about the COVID situation.  Wife works in nursing home and has + case.  He drives for grocery stores.  He is terrified about bringing the virus home to their children.  Add Alprazolam PRN.  Start Buspar BID.  Follow up visit in 2 weeks.  Pt expressed understanding and is in agreement w/ plan.   Follow Up Instructions: F/U visit in 2 weeks   I discussed the assessment and treatment plan with the patient. The patient was provided an opportunity to ask questions and all were answered. The patient agreed with the plan and demonstrated an understanding of the instructions.   The patient was advised to call back or seek an in-person evaluation if the symptoms worsen or if the condition fails to improve as anticipated.  I provided 10 minutes of  non-face-to-face time during this encounter.   Annye Asa, MD

## 2018-12-24 NOTE — Progress Notes (Signed)
I have discussed the procedure for the virtual visit with the patient who has given consent to proceed with assessment and treatment.   Denean Pavon, CMA     

## 2019-01-07 ENCOUNTER — Encounter: Payer: Self-pay | Admitting: Family Medicine

## 2019-01-07 ENCOUNTER — Other Ambulatory Visit: Payer: Self-pay

## 2019-01-07 ENCOUNTER — Ambulatory Visit (INDEPENDENT_AMBULATORY_CARE_PROVIDER_SITE_OTHER): Payer: Commercial Managed Care - PPO | Admitting: Family Medicine

## 2019-01-07 VITALS — Ht 71.0 in | Wt 203.0 lb

## 2019-01-07 DIAGNOSIS — F419 Anxiety disorder, unspecified: Secondary | ICD-10-CM

## 2019-01-07 NOTE — Progress Notes (Signed)
   Virtual Visit via Video   I connected with Kyle Nolan on 01/07/19 at  9:30 AM EDT by a video enabled telemedicine application and verified that I am speaking with the correct person using two identifiers. Location patient: Home Location provider: Acup[facility], Office Persons participating in the virtual visit: pt and myself  I discussed the limitations of evaluation and management by telemedicine and the availability of in person appointments. The patient expressed understanding and agreed to proceed.  Subjective:   HPI:  Anxiety- pt was started on Buspar BID last visit (2 weeks ago) in addition to Prozac 40mg .  He was given Alprazolam to use as a rescue medication.  Pt reports feeling better.  Fiance has COVID outbreak at the nursing home.  Pt reports feeling better at work.  Pt reports he would like to stay at current dose.  Sleeping better.  Feeling less wound up.  Tension is less.    ROS: See pertinent positives and negatives per HPI.  Patient Active Problem List   Diagnosis Date Noted  . Obesity (BMI 30.0-34.9) 03/02/2018  . Routine general medical examination at a health care facility 12/16/2013  . Medication side effect 07/22/2013  . Anxiety 06/20/2013  . Chest pain, unspecified 06/20/2013  . Insomnia 06/20/2013  . Snuff user 03/12/2012  . Mouth lesion 03/12/2012    Social History   Tobacco Use  . Smoking status: Never Smoker  . Smokeless tobacco: Never Used  Substance Use Topics  . Alcohol use: Yes    Comment: occasionally    Current Outpatient Medications:  .  ALPRAZolam (XANAX) 0.5 MG tablet, 1/2-1 tab twice daily PRN sever anxiety, Disp: 20 tablet, Rfl: 0 .  busPIRone (BUSPAR) 7.5 MG tablet, Take 1 tablet (7.5 mg total) by mouth 2 (two) times daily., Disp: 60 tablet, Rfl: 3 .  FLUoxetine (PROZAC) 40 MG capsule, Take 1 capsule (40 mg total) by mouth daily., Disp: 30 capsule, Rfl: 3 .  fluticasone (FLONASE) 50 MCG/ACT nasal spray, Place 2 sprays into  both nostrils daily., Disp: 16 g, Rfl: 6  No Known Allergies  Objective:   Ht 5\' 11"  (1.803 m)   Wt 203 lb (92.1 kg)   BMI 28.31 kg/m  AAOx3, NAD NCAT, EOMI No obvious CN deficits Coloring WNL Pt is able to speak clearly, coherently without shortness of breath or increased work of breathing.  Thought process is linear.  Mood is appropriate.   Assessment and Plan:   Anxiety- improved since adding Buspar.  Pt feels the biggest difference is his ability to sleep.  Less tense at work and at home.  Would like to remain at current med doses.  Encouraged regular exercise as an additional stress outlet.  Will continue to follow.   Annye Asa, MD 01/07/2019

## 2019-01-07 NOTE — Progress Notes (Signed)
I have discussed the procedure for the virtual visit with the patient who has given consent to proceed with assessment and treatment.   Granville Whitefield, CMA     

## 2019-03-22 ENCOUNTER — Other Ambulatory Visit: Payer: Self-pay | Admitting: Physician Assistant

## 2019-03-22 NOTE — Telephone Encounter (Signed)
Will defer further refills of patient's medications to PCP  

## 2019-04-19 ENCOUNTER — Other Ambulatory Visit: Payer: Self-pay | Admitting: Family Medicine

## 2019-05-28 ENCOUNTER — Encounter: Payer: Self-pay | Admitting: Family Medicine

## 2019-05-28 MED ORDER — BUSPIRONE HCL 7.5 MG PO TABS: 7.5000 mg | ORAL_TABLET | Freq: Two times a day (BID) | ORAL | 0 refills | Status: DC

## 2019-05-28 MED ORDER — FLUOXETINE HCL 40 MG PO CAPS: 40.0000 mg | ORAL_CAPSULE | Freq: Every day | ORAL | 1 refills | Status: DC

## 2019-05-28 MED ORDER — FLUTICASONE PROPIONATE 50 MCG/ACT NA SUSP: 2.0000 | Freq: Every day | NASAL | 1 refills | Status: DC

## 2019-05-28 NOTE — Telephone Encounter (Signed)
Last OV 01/07/19 Alprazolam last filled 12/24/18 #20 with 0  Pt would like to know if Alprazolam could be filled for 60 days at least as he is starting a new job.

## 2019-05-29 MED ORDER — ALPRAZOLAM 0.5 MG PO TABS: ORAL_TABLET | ORAL | 0 refills | Status: DC

## 2019-06-12 ENCOUNTER — Encounter: Payer: Self-pay | Admitting: Family Medicine

## 2019-06-13 ENCOUNTER — Encounter: Payer: Self-pay | Admitting: Family Medicine

## 2019-07-29 ENCOUNTER — Encounter: Payer: Self-pay | Admitting: Family Medicine

## 2019-08-26 ENCOUNTER — Encounter: Payer: Self-pay | Admitting: Family Medicine

## 2019-09-02 ENCOUNTER — Other Ambulatory Visit: Payer: Self-pay

## 2019-09-02 ENCOUNTER — Ambulatory Visit (HOSPITAL_COMMUNITY): Payer: Commercial Managed Care - PPO | Attending: Obstetrics and Gynecology

## 2019-09-02 NOTE — Progress Notes (Signed)
Blood drawn for [facility] familial variant testing in the ALPL gene (daughter with hypophosphatasia).

## 2019-09-09 ENCOUNTER — Other Ambulatory Visit: Payer: Self-pay | Admitting: General Practice

## 2019-09-09 ENCOUNTER — Ambulatory Visit (INDEPENDENT_AMBULATORY_CARE_PROVIDER_SITE_OTHER): Payer: Commercial Managed Care - PPO | Admitting: Family Medicine

## 2019-09-09 ENCOUNTER — Other Ambulatory Visit: Payer: Self-pay

## 2019-09-09 ENCOUNTER — Encounter: Payer: Self-pay | Admitting: Gastroenterology

## 2019-09-09 ENCOUNTER — Encounter: Payer: Self-pay | Admitting: Family Medicine

## 2019-09-09 VITALS — BP 121/74 | HR 74 | Temp 97.8°F | Resp 16 | Ht 71.0 in | Wt 212.0 lb

## 2019-09-09 DIAGNOSIS — Z125 Encounter for screening for malignant neoplasm of prostate: Secondary | ICD-10-CM

## 2019-09-09 DIAGNOSIS — Z1211 Encounter for screening for malignant neoplasm of colon: Secondary | ICD-10-CM

## 2019-09-09 DIAGNOSIS — Z23 Encounter for immunization: Secondary | ICD-10-CM | POA: Diagnosis not present

## 2019-09-09 DIAGNOSIS — E663 Overweight: Secondary | ICD-10-CM | POA: Diagnosis not present

## 2019-09-09 DIAGNOSIS — Z0000 Encounter for general adult medical examination without abnormal findings: Secondary | ICD-10-CM | POA: Diagnosis not present

## 2019-09-09 DIAGNOSIS — R972 Elevated prostate specific antigen [PSA]: Secondary | ICD-10-CM

## 2019-09-09 LAB — CBC WITH DIFFERENTIAL/PLATELET
Basophils Absolute: 0.1 10*3/uL (ref 0.0–0.1)
Basophils Relative: 1 % (ref 0.0–3.0)
Eosinophils Absolute: 0.2 10*3/uL (ref 0.0–0.7)
Eosinophils Relative: 2.2 % (ref 0.0–5.0)
HCT: 43.5 % (ref 39.0–52.0)
Hemoglobin: 15.1 g/dL (ref 13.0–17.0)
Lymphocytes Relative: 29.9 % (ref 12.0–46.0)
Lymphs Abs: 2.4 10*3/uL (ref 0.7–4.0)
MCHC: 34.6 g/dL (ref 30.0–36.0)
MCV: 90 fl (ref 78.0–100.0)
Monocytes Absolute: 0.6 10*3/uL (ref 0.1–1.0)
Monocytes Relative: 6.8 % (ref 3.0–12.0)
Neutro Abs: 4.9 10*3/uL (ref 1.4–7.7)
Neutrophils Relative %: 60.1 % (ref 43.0–77.0)
Platelets: 240 10*3/uL (ref 150.0–400.0)
RBC: 4.84 Mil/uL (ref 4.22–5.81)
RDW: 13 % (ref 11.5–15.5)
WBC: 8.2 10*3/uL (ref 4.0–10.5)

## 2019-09-09 LAB — BASIC METABOLIC PANEL
BUN: 14 mg/dL (ref 6–23)
CO2: 26 mEq/L (ref 19–32)
Calcium: 9.2 mg/dL (ref 8.4–10.5)
Chloride: 104 mEq/L (ref 96–112)
Creatinine, Ser: 1.03 mg/dL (ref 0.40–1.50)
GFR: 76.38 mL/min (ref >60.00)
Glucose, Bld: 88 mg/dL (ref 70–99)
Potassium: 3.9 mEq/L (ref 3.5–5.1)
Sodium: 139 mEq/L (ref 135–145)

## 2019-09-09 LAB — HEPATIC FUNCTION PANEL
ALT: 14 U/L (ref 0–53)
AST: 14 U/L (ref 0–37)
Albumin: 4.4 g/dL (ref 3.5–5.2)
Alkaline Phosphatase: 34 U/L — ABNORMAL LOW (ref 39–117)
Bilirubin, Direct: 0.1 mg/dL (ref 0.0–0.3)
Total Bilirubin: 0.6 mg/dL (ref 0.2–1.2)
Total Protein: 6.9 g/dL (ref 6.0–8.3)

## 2019-09-09 LAB — PSA: PSA: 5.8 ng/mL — ABNORMAL HIGH (ref 0.10–4.00)

## 2019-09-09 LAB — LIPID PANEL
Cholesterol: 238 mg/dL — ABNORMAL HIGH (ref 0–200)
HDL: 35.6 mg/dL — ABNORMAL LOW (ref >39.00)
Total CHOL/HDL Ratio: 7
Triglycerides: 435 mg/dL — ABNORMAL HIGH (ref 0.0–149.0)

## 2019-09-09 LAB — TSH: TSH: 1.03 u[IU]/mL (ref 0.35–4.50)

## 2019-09-09 LAB — LDL CHOLESTEROL, DIRECT: Direct LDL: 149 mg/dL

## 2019-09-09 MED ORDER — FENOFIBRATE 160 MG PO TABS: 160.0000 mg | ORAL_TABLET | Freq: Every day | ORAL | 1 refills | Status: DC

## 2019-09-09 NOTE — Assessment & Plan Note (Signed)
Pt has gained 9 lbs since last visit.  Discussed need for healthy diet and regular exercise.  Check labs to risk stratify.  Will follow.

## 2019-09-09 NOTE — Progress Notes (Signed)
   Subjective:    Patient ID: Kyle Nolan, male    DOB: 24-Dec-1970, 48 y.o.   MRN: [id number]  HPI CPE- Due for Tdap, flu, colonoscopy.  Pt has gained 9 lbs since last visit   Review of Systems Patient reports no vision/hearing changes, anorexia, fever ,adenopathy, persistant/recurrent hoarseness, swallowing issues, chest pain, palpitations, edema, persistant/recurrent cough, hemoptysis, dyspnea (rest,exertional, paroxysmal nocturnal), gastrointestinal  bleeding (melena, rectal bleeding), abdominal pain, excessive heart burn, GU symptoms (dysuria, hematuria, voiding/incontinence issues) syncope, focal weakness, memory loss, numbness & tingling, skin/hair/nail changes, depression, anxiety, abnormal bruising/bleeding, musculoskeletal symptoms/signs.   This visit occurred during the SARS-CoV-2 public health emergency.  Safety protocols were in place, including screening questions prior to the visit, additional usage of staff PPE, and extensive cleaning of exam room while observing appropriate contact time as indicated for disinfecting solutions.       Objective:   Physical Exam General Appearance:    Alert, cooperative, no distress, appears stated age  Head:    Normocephalic, without obvious abnormality, atraumatic  Eyes:    PERRL, conjunctiva/corneas clear, EOM's intact, fundi    benign, both eyes       Ears:    Normal TM's and external ear canals, both ears  Nose:   Deferred due to COVID  Throat:   Neck:   Supple, symmetrical, trachea midline, no adenopathy;       thyroid:  No enlargement/tenderness/nodules  Back:     Symmetric, no curvature, ROM normal, no CVA tenderness  Lungs:     Clear to auscultation bilaterally, respirations unlabored  Chest wall:    No tenderness or deformity  Heart:    Regular rate and rhythm, S1 and S2 normal, no murmur, rub   or gallop  Abdomen:     Soft, non-tender, bowel sounds active all four quadrants,    no masses, no organomegaly  Genitalia:    Normal  male without lesion, masses,discharge or tenderness  Rectal:    Deferred  Extremities:   Extremities normal, atraumatic, no cyanosis or edema  Pulses:   2+ and symmetric all extremities  Skin:   Skin color, texture, turgor normal, no rashes or lesions  Lymph nodes:   Cervical, supraclavicular, and axillary nodes normal  Neurologic:   CNII-XII intact. Normal strength, sensation and reflexes      throughout          Assessment & Plan:

## 2019-09-09 NOTE — Patient Instructions (Signed)
Follow up in 1 year or as needed We'll notify you of your lab results and make any changes if needed We'll call you with your GI appt for the colonoscopy Continue to work on healthy diet and regular exercise- you can do it! Call with any questions or concerns Stay Safe!  Stay Healthy! Happy Holidays!

## 2019-09-09 NOTE — Assessment & Plan Note (Signed)
Pt's PE WNL w/ exception of being overweight.  Tdap and flu given.  Referral made for colonoscopy.  Check labs.  Anticipatory guidance provided.

## 2019-09-23 ENCOUNTER — Other Ambulatory Visit: Payer: Self-pay | Admitting: Family Medicine

## 2019-09-23 MED ORDER — ALPRAZOLAM 0.5 MG PO TABS: ORAL_TABLET | ORAL | 0 refills | Status: DC

## 2019-09-23 NOTE — Telephone Encounter (Signed)
Last office visit 09/09/2019 Alprazolam 0.5mg  #60 last filled 05/29/2019

## 2019-10-01 ENCOUNTER — Other Ambulatory Visit: Payer: Self-pay

## 2019-10-01 ENCOUNTER — Ambulatory Visit (AMBULATORY_SURGERY_CENTER): Payer: Commercial Managed Care - PPO

## 2019-10-01 VITALS — Temp 97.6°F | Ht 71.0 in | Wt 211.0 lb

## 2019-10-01 DIAGNOSIS — Z1211 Encounter for screening for malignant neoplasm of colon: Secondary | ICD-10-CM

## 2019-10-01 DIAGNOSIS — Z1159 Encounter for screening for other viral diseases: Secondary | ICD-10-CM

## 2019-10-01 MED ORDER — NA SULFATE-K SULFATE-MG SULF 17.5-3.13-1.6 GM/177ML PO SOLN: 1.0000 | Freq: Once | ORAL | 0 refills | Status: AC

## 2019-10-01 NOTE — Progress Notes (Signed)

## 2019-10-03 ENCOUNTER — Encounter: Payer: Self-pay | Admitting: Gastroenterology

## 2019-10-07 ENCOUNTER — Other Ambulatory Visit (HOSPITAL_COMMUNITY): Payer: Self-pay | Admitting: Genetic Counselor

## 2019-10-08 ENCOUNTER — Ambulatory Visit (INDEPENDENT_AMBULATORY_CARE_PROVIDER_SITE_OTHER): Payer: Commercial Managed Care - PPO

## 2019-10-08 ENCOUNTER — Other Ambulatory Visit: Payer: Self-pay | Admitting: Gastroenterology

## 2019-10-08 DIAGNOSIS — Z1159 Encounter for screening for other viral diseases: Secondary | ICD-10-CM

## 2019-10-09 LAB — SARS CORONAVIRUS 2 (TAT 6-24 HRS): SARS Coronavirus 2: NEGATIVE

## 2019-10-11 ENCOUNTER — Ambulatory Visit (AMBULATORY_SURGERY_CENTER): Payer: Commercial Managed Care - PPO | Admitting: Gastroenterology

## 2019-10-11 ENCOUNTER — Other Ambulatory Visit: Payer: Self-pay

## 2019-10-11 ENCOUNTER — Encounter: Payer: Self-pay | Admitting: Gastroenterology

## 2019-10-11 VITALS — BP 122/76 | HR 60 | Temp 97.9°F | Resp 18 | Ht 71.0 in | Wt 211.0 lb

## 2019-10-11 DIAGNOSIS — Z1211 Encounter for screening for malignant neoplasm of colon: Secondary | ICD-10-CM | POA: Diagnosis present

## 2019-10-11 DIAGNOSIS — K621 Rectal polyp: Secondary | ICD-10-CM

## 2019-10-11 DIAGNOSIS — D122 Benign neoplasm of ascending colon: Secondary | ICD-10-CM

## 2019-10-11 DIAGNOSIS — D128 Benign neoplasm of rectum: Secondary | ICD-10-CM

## 2019-10-11 MED ORDER — SODIUM CHLORIDE 0.9 % IV SOLN: 500.0000 mL | Freq: Once | INTRAVENOUS | Status: DC

## 2019-10-11 NOTE — Progress Notes (Signed)
VS [place] to room to assist during endoscopic procedure.  Patient ID and intended procedure confirmed with present staff. Received instructions for my participation in the procedure from the performing physician.

## 2019-10-11 NOTE — Progress Notes (Signed)
Report to PACU, RN, vss, BBS= Clear.  

## 2019-10-11 NOTE — Progress Notes (Signed)
Temp  LC Vitals KA

## 2019-10-11 NOTE — Op Note (Signed)
[facility] Patient Name: Kyle Nolan Procedure Date: 10/11/2019 10:33 AM MRN: [id number] Endoscopist: Remo Lipps P. Havery Moros , MD Age: 49 Referring MD:  Date of Birth: [birth year] Gender: Male Account #: [id number] Procedure:                Colonoscopy Indications:              Screening for colorectal malignant neoplasm, This                            is the patient's first colonoscopy Medicines:                Monitored Anesthesia Care Procedure:                Pre-Anesthesia Assessment:                           - Prior to the procedure, a History and Physical                            was performed, and patient medications and                            allergies were reviewed. The patient's tolerance of                            previous anesthesia was also reviewed. The risks                            and benefits of the procedure and the sedation                            options and risks were discussed with the patient.                            All questions were answered, and informed consent                            was obtained. Prior Anticoagulants: The patient has                            taken no previous anticoagulant or antiplatelet                            agents. ASA Grade Assessment: II - A patient with                            mild systemic disease. After reviewing the risks                            and benefits, the patient was deemed in                            satisfactory condition to undergo the procedure.  After obtaining informed consent, the colonoscope                            was passed under direct vision. Throughout the                            procedure, the patient's blood pressure, pulse, and                            oxygen saturations were monitored continuously. The                            Colonoscope was introduced through the anus and                            advanced to the the  cecum, identified by                            appendiceal orifice and ileocecal valve. The                            colonoscopy was performed without difficulty. The                            patient tolerated the procedure well. The quality                            of the bowel preparation was good. The ileocecal                            valve, appendiceal orifice, and rectum were                            photographed. Scope In: 10:39:49 AM Scope Out: 10:55:27 AM Scope Withdrawal Time: 0 hours 14 minutes 27 seconds  Total Procedure Duration: 0 hours 15 minutes 38 seconds  Findings:                 There was a small perianal excoriation, the                            perianal and digital rectal examinations were                            otherwise normal.                           A 7-8 mm polyp was found in the ascending colon.                            The polyp was sessile. The polyp was removed with a                            cold snare. Resection and retrieval were complete.  A 4 mm polyp was found in the rectum. The polyp was                            sessile. The polyp was removed with a cold snare.                            Resection and retrieval were complete.                           A few small-mouthed diverticula were found in the                            sigmoid colon.                           Internal hemorrhoids were found during                            retroflexion. The hemorrhoids were small.                           The exam was otherwise without abnormality. Complications:            No immediate complications. Estimated blood loss:                            Minimal. Estimated Blood Loss:     Estimated blood loss was minimal. Impression:               - One 7-8 mm polyp in the ascending colon, removed                            with a cold snare. Resected and retrieved.                           - One 4 mm polyp in  the rectum, removed with a cold                            snare. Resected and retrieved.                           - Diverticulosis in the sigmoid colon.                           - Internal hemorrhoids.                           - The examination was otherwise normal. Recommendation:           - Patient has a contact number available for                            emergencies. The signs and symptoms of potential                            delayed complications  were discussed with the                            patient. Return to normal activities tomorrow.                            Written discharge instructions were provided to the                            patient.                           - Resume previous diet.                           - Continue present medications.                           - Await pathology results. Remo Lipps P. Kabrea Seeney, MD 10/11/2019 10:59:34 AM This report has been signed electronically.

## 2019-10-11 NOTE — Patient Instructions (Signed)
Discharge instructions given. Handouts on polyps,diverticulosis and hemorrhoids. Resume previous medications. YOU HAD AN ENDOSCOPIC PROCEDURE TODAY AT THE [facility]:   Refer to the procedure report that was given to you for any specific questions about what was found during the examination.  If the procedure report does not answer your questions, please call your gastroenterologist to clarify.  If you requested that your care partner not be given the details of your procedure findings, then the procedure report has been included in a sealed envelope for you to review at your convenience later.  YOU SHOULD EXPECT: Some feelings of bloating in the abdomen. Passage of more gas than usual.  Walking can help get rid of the air that was put into your GI tract during the procedure and reduce the bloating. If you had a lower endoscopy (such as a colonoscopy or flexible sigmoidoscopy) you may notice spotting of blood in your stool or on the toilet paper. If you underwent a bowel prep for your procedure, you may not have a normal bowel movement for a few days.  Please Note:  You might notice some irritation and congestion in your nose or some drainage.  This is from the oxygen used during your procedure.  There is no need for concern and it should clear up in a day or so.  SYMPTOMS TO REPORT IMMEDIATELY:   Following lower endoscopy (colonoscopy or flexible sigmoidoscopy):  Excessive amounts of blood in the stool  Significant tenderness or worsening of abdominal pains  Swelling of the abdomen that is new, acute  Fever of 100F or higher  For urgent or emergent issues, a gastroenterologist can be reached at any hour by calling [phone].   DIET:  We do recommend a small meal at first, but then you may proceed to your regular diet.  Drink plenty of fluids but you should avoid alcoholic beverages for 24 hours.  ACTIVITY:  You should plan to take it easy for the rest of today and you  should NOT DRIVE or use heavy machinery until tomorrow (because of the sedation medicines used during the test).    FOLLOW UP: Our staff will call the number listed on your records 48-72 hours following your procedure to check on you and address any questions or concerns that you may have regarding the information given to you following your procedure. If we do not reach you, we will leave a message.  We will attempt to reach you two times.  During this call, we will ask if you have developed any symptoms of COVID 19. If you develop any symptoms (ie: fever, flu-like symptoms, shortness of breath, cough etc.) before then, please call [phone].  If you test positive for Covid 19 in the 2 weeks post procedure, please call and report this information to us.    If any biopsies were taken you will be contacted by phone or by letter within the next 1-3 weeks.  Please call us at [phone] if you have not heard about the biopsies in 3 weeks.    SIGNATURES/CONFIDENTIALITY: You and/or your care partner have signed paperwork which will be entered into your electronic medical record.  These signatures attest to the fact that that the information above on your After Visit Summary has been reviewed and is understood.  Full responsibility of the confidentiality of this discharge information lies with you and/or your care-partner. 

## 2019-10-14 LAB — PSA: PSA: 1.24

## 2019-10-15 ENCOUNTER — Telehealth: Payer: Self-pay

## 2019-10-15 NOTE — Telephone Encounter (Signed)
Left message on answering machine. 

## 2019-10-22 ENCOUNTER — Encounter: Payer: Self-pay | Admitting: General Practice

## 2019-10-26 ENCOUNTER — Other Ambulatory Visit: Payer: Self-pay | Admitting: Family Medicine

## 2019-11-29 ENCOUNTER — Encounter: Payer: Self-pay | Admitting: Family Medicine

## 2019-12-26 ENCOUNTER — Other Ambulatory Visit: Payer: Self-pay | Admitting: Family Medicine

## 2020-01-30 ENCOUNTER — Other Ambulatory Visit: Payer: Self-pay

## 2020-01-30 MED ORDER — BUSPIRONE HCL 7.5 MG PO TABS: 7.5000 mg | ORAL_TABLET | Freq: Two times a day (BID) | ORAL | 1 refills | Status: DC

## 2020-01-30 MED ORDER — ALPRAZOLAM 0.5 MG PO TABS: ORAL_TABLET | ORAL | 0 refills | Status: DC

## 2020-01-30 MED ORDER — FENOFIBRATE 160 MG PO TABS: 160.0000 mg | ORAL_TABLET | Freq: Every day | ORAL | 1 refills | Status: DC

## 2020-01-30 MED ORDER — FLUTICASONE PROPIONATE 50 MCG/ACT NA SUSP: 2.0000 | Freq: Every day | NASAL | 1 refills | Status: DC

## 2020-01-30 MED ORDER — FLUOXETINE HCL 40 MG PO CAPS: 40.0000 mg | ORAL_CAPSULE | Freq: Every day | ORAL | 1 refills | Status: DC

## 2020-01-30 NOTE — Telephone Encounter (Signed)
Alprazolam- last filled 09/23/19 #60 with no refills Last office visit 09/09/19 [facility] office visit 09/14/20

## 2020-05-17 ENCOUNTER — Other Ambulatory Visit: Payer: Self-pay

## 2020-05-17 ENCOUNTER — Encounter (HOSPITAL_COMMUNITY): Payer: Self-pay

## 2020-05-17 ENCOUNTER — Emergency Department (HOSPITAL_COMMUNITY)
Admission: EM | Admit: 2020-05-17 | Discharge: 2020-05-18 | Disposition: A | Discharge status: Still In Hospital | Payer: PRIVATE HEALTH INSURANCE | Attending: Emergency Medicine | Admitting: Emergency Medicine

## 2020-05-17 DIAGNOSIS — Z20822 Contact with and (suspected) exposure to covid-19: Secondary | ICD-10-CM | POA: Insufficient documentation

## 2020-05-17 DIAGNOSIS — F339 Major depressive disorder, recurrent, unspecified: Secondary | ICD-10-CM | POA: Diagnosis not present

## 2020-05-17 DIAGNOSIS — F333 Major depressive disorder, recurrent, severe with psychotic symptoms: Secondary | ICD-10-CM | POA: Insufficient documentation

## 2020-05-17 DIAGNOSIS — F32A Depression, unspecified: Secondary | ICD-10-CM

## 2020-05-17 HISTORY — DX: Depression, unspecified: F32.A

## 2020-05-17 LAB — ACETAMINOPHEN LEVEL: Acetaminophen (Tylenol), Serum: <10 ug/mL — ABNORMAL LOW (ref 10–30)

## 2020-05-17 LAB — COMPREHENSIVE METABOLIC PANEL
ALT: 27 U/L (ref 0–44)
AST: 24 U/L (ref 15–41)
Albumin: 4.6 g/dL (ref 3.5–5.0)
Alkaline Phosphatase: 25 U/L — ABNORMAL LOW (ref 38–126)
Anion gap: 13 (ref 5–15)
BUN: 13 mg/dL (ref 6–20)
CO2: 22 mmol/L (ref 22–32)
Calcium: 9.3 mg/dL (ref 8.9–10.3)
Chloride: 106 mmol/L (ref 98–111)
Creatinine, Ser: 1.13 mg/dL (ref 0.61–1.24)
GFR calc Af Amer: >60 mL/min (ref >60)
GFR calc non Af Amer: >60 mL/min (ref >60)
Glucose, Bld: 104 mg/dL — ABNORMAL HIGH (ref 70–99)
Potassium: 3.1 mmol/L — ABNORMAL LOW (ref 3.5–5.1)
Sodium: 141 mmol/L (ref 135–145)
Total Bilirubin: 2 mg/dL — ABNORMAL HIGH (ref 0.3–1.2)
Total Protein: 7.5 g/dL (ref 6.5–8.1)

## 2020-05-17 LAB — RAPID URINE DRUG SCREEN, HOSP PERFORMED
Amphetamines: NOT DETECTED
Barbiturates: NOT DETECTED
Benzodiazepines: NOT DETECTED
Cocaine: NOT DETECTED
Opiates: NOT DETECTED
Tetrahydrocannabinol: NOT DETECTED

## 2020-05-17 LAB — CBC
HCT: 44.1 % (ref 39.0–52.0)
Hemoglobin: 14.7 g/dL (ref 13.0–17.0)
MCH: 31 pg (ref 26.0–34.0)
MCHC: 33.3 g/dL (ref 30.0–36.0)
MCV: 93 fL (ref 80.0–100.0)
Platelets: 270 10*3/uL (ref 150–400)
RBC: 4.74 MIL/uL (ref 4.22–5.81)
RDW: 11.9 % (ref 11.5–15.5)
WBC: 8.3 10*3/uL (ref 4.0–10.5)
nRBC: 0 % (ref 0.0–0.2)

## 2020-05-17 LAB — ETHANOL: Alcohol, Ethyl (B): <10 mg/dL (ref <10)

## 2020-05-17 LAB — SARS CORONAVIRUS 2 BY RT PCR (HOSPITAL ORDER, PERFORMED IN ~~LOC~~ HOSPITAL LAB): SARS Coronavirus 2: NEGATIVE

## 2020-05-17 LAB — SALICYLATE LEVEL: Salicylate Lvl: <7 mg/dL — ABNORMAL LOW (ref 7.0–30.0)

## 2020-05-17 MED ORDER — ZOLPIDEM TARTRATE 5 MG PO TABS: 5.0000 mg | ORAL_TABLET | Freq: Every evening | ORAL | Status: DC | PRN

## 2020-05-17 MED ORDER — ACETAMINOPHEN 325 MG PO TABS: 650.0000 mg | ORAL_TABLET | ORAL | Status: DC | PRN

## 2020-05-17 MED ORDER — FLUOXETINE HCL 20 MG PO CAPS
40.0000 mg | ORAL_CAPSULE | Freq: Every day | ORAL | Status: DC
  Administered 2020-05-18: 40 mg via ORAL
  Filled 2020-05-17: qty 2

## 2020-05-17 MED ORDER — BUSPIRONE HCL 5 MG PO TABS
7.5000 mg | ORAL_TABLET | Freq: Two times a day (BID) | ORAL | Status: DC
  Filled 2020-05-17 (×3): qty 1.5

## 2020-05-17 MED ORDER — ALUM & MAG HYDROXIDE-SIMETH 200-200-20 MG/5ML PO SUSP: 30.0000 mL | Freq: Four times a day (QID) | ORAL | Status: DC | PRN

## 2020-05-17 NOTE — BH Assessment (Signed)
Tele Assessment Note   Patient Name: Kyle Nolan MRN: [id number] Referring Physician: Dorie Rank, MD Location of Patient: [facility], [phone] Location of Provider: [place] Department  Kyle Nolan is an 49 y.o. single male who presents unaccompanied to [facility] after being petitioned for involuntary commitment by his ex-girlfriend, Kyle Nolan [phone]. Affidavit and petition states: "Respondent takes Prozac, Xanax and high blood pressure meds. Respondent is not eating, sleeping, or taking care of his personal hygiene. Respondent has stated multiple times he would kill himself and said that"it would be clean". Has sent 'goodbye' videos."  Pt states that he is not suicidal and that this situation is "a misunderstanding." He says that he and his girlfriend of 8 years broke up two months ago. He says they have two children together, age 82 and 57, and that he doesn't want to be "a part-time father." Pt says he has three adult children whom he raised and he doesn't "think being a a father on the weekends is anything to be proud of." He says he asked his company for a transfer and is being sent to [place]. Pt says he was saying goodbye because he is moving, not because he wants to kill himself.  Pt acknowledges a history of depression and that he has felt sad due to the breakup. He denies current suicidal ideation or history of suicide attempts. Pt denies any history of intentional self-injurious behaviors. Pt denies current homicidal ideation or history of violence. Pt denies any history of auditory or visual hallucinations. Pt denies history of alcohol or other substance use.  Pt reports he lives alone. He says he no longer wants to live in [place]. He denies legal problems. He denies access to firearms. He denies history of inpatient psychiatric treatment. Pt says his primary care physician, Dr. Urbano Heir, prescribes his mental health medications.  TTS contacted  petitioner, Kyle Nolan, for collateral information. She says Pt has a history of depression but she has "never seen him like this." She states after they broke up he moved out and hasn't seen the children. She says he found out last week she was seeing someone else and he began sending her videos repeatedly  stating he was going to kill himself. She says he didn't specify how he was going to do it but that it would be "clean." She said she talked about getting back together just so he wouldn't kill himself. She says she called law enforcement but they could not locate Pt. She says last week he applied for a handgun. 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 She says he "has hit rock bottom" and that she is very concerned for his safety.  Pt is dressed in hospital scrubs, alert and oriented x4. Pt speaks in a clear tone, at moderate volume and normal pace. Motor behavior appears normal. Eye contact is good. Pt's mood is euthymic and affect is congruent with mood. Thought process is coherent and relevant. There is no indication Pt is currently responding to internal stimuli or experiencing delusional thought content. Pt was calm and cooperative throughout assessment.    Diagnosis: F33.3 Major depressive disorder, Recurrent episode, With psychotic features  Past Medical History:  Past Medical History:  Diagnosis Date  . Allergy    seasonal  . Anxiety 06/20/2013  . Chest pain, unspecified 06/20/2013  . Depression   . Insomnia 06/20/2013  . Medication side effect 07/22/2013  .  Mouth lesion 03/12/2012  . Obesity (BMI 30.0-34.9) 03/02/2018  . Routine general medical examination at a health care facility 12/16/2013  . Snuff user 03/12/2012    Past Surgical History:  Procedure Laterality Date  . WISDOM TOOTH EXTRACTION      Family History:  Family History  Problem Relation Age of Onset  . Hyperlipidemia Mother   . Hypertension Mother   . Hyperlipidemia  Father   . Hypertension Father   . Depression Father   . OCD Father   . Cancer Maternal Uncle   . Hyperlipidemia Maternal Grandmother   . Hypertension Maternal Grandmother   . Cancer Maternal Grandfather   . Hyperlipidemia Maternal Grandfather   . Hypertension Maternal Grandfather   . Cancer Paternal Grandmother   . Hyperlipidemia Paternal Grandmother   . Hypertension Paternal Grandmother   . Cancer Paternal Grandfather   . Hyperlipidemia Paternal Grandfather   . Hypertension Paternal Grandfather   . Diabetes Paternal Grandfather   . Colon cancer Neg Hx   . Colon polyps Neg Hx   . Esophageal cancer Neg Hx   . Rectal cancer Neg Hx   . Stomach cancer Neg Hx     Social History:  reports that he has never smoked. His smokeless tobacco use includes snuff. He reports current alcohol use. He reports current drug use. Frequency: 1.00 time per week. Drugs: Cocaine, Marijuana, and Benzodiazepines.  Additional Social History:  Alcohol / Drug Use Pain Medications: See MAR Prescriptions: See MAR Over the Counter: See MAR History of alcohol / drug use?: No history of alcohol / drug abuse Longest period of sobriety (when/how long): NA Substance #1 Name of Substance 1: Marijuana 1 - Age of First Use: 9 1 - Amount (size/oz): varied 1 - Frequency: Weekly 1 - Duration: Ongoing 1 - Last Use / Amount: 05/16/2020 Substance #2 Name of Substance 2: Cocaine 2 - Amount (size/oz): Varied 2 - Frequency: Episodic 2 - Duration: Ongoing 2 - Last Use / Amount: 05/16/2020  CIWA: CIWA-Ar BP: (!) 140/95 Pulse Rate: 91 COWS:    Allergies: No Known Allergies  Home Medications: (Not in a hospital admission)   OB/GYN Status:  No LMP for male patient.  General Assessment Data Location of Assessment: [facility] TTS Assessment: In system Is this a Tele or Face-to-Face Assessment?: Tele Assessment Is this an Initial Assessment or a Re-assessment for this encounter?: Initial Assessment Patient  Accompanied by:: N/A Language Other than English: No Living Arrangements: Other (Comment) (Lives alone) What gender do you identify as?: Male Date Telepsych consult ordered in [facility]: 05/17/20 Time Telepsych consult ordered in [facility]: 1539 Marital status: Single Maiden name: NA Pregnancy Status: No Living Arrangements: Alone Can pt return to current living arrangement?: Yes Admission Status: Involuntary Petitioner: Other (Ex-girlfriend) Is patient capable of signing voluntary admission?: Yes Referral Source: Other Insurance type: Self-pay     Crisis Care Plan Living Arrangements: Alone Legal Guardian: Other: (Self) Name of Psychiatrist: Dr. Cornell Barman Name of Therapist: None  Education Status Is patient currently in school?: No Is the patient employed, unemployed or receiving disability?: Employed  Risk to self with the past 6 months Suicidal Ideation: Yes-Currently Present Has patient been a risk to self within the past 6 months prior to admission? : Yes Suicidal Intent: No Has patient had any suicidal intent within the past 6 months prior to admission? : Yes Is patient at risk for suicide?: Yes Suicidal Plan?: No Has patient had any suicidal plan within the past 6 months prior  to admission? : No Access to Means: No What has been your use of drugs/alcohol within the last 12 months?: Pt denies Previous Attempts/Gestures: No How many times?: 0 Other Self Harm Risks: None Triggers for Past Attempts: None known Intentional Self Injurious Behavior: None Family Suicide History: No Recent stressful life event(s): Loss (Comment) (Relationship with girlfriend ended) Persecutory voices/beliefs?: No Depression: Yes Depression Symptoms: Despondent, Insomnia, Tearfulness, Fatigue, Feeling angry/irritable Substance abuse history and/or treatment for substance abuse?: No Suicide prevention information given to non-admitted patients: Not applicable  Risk to Others within the past 6  months Homicidal Ideation: No Does patient have any lifetime risk of violence toward others beyond the six months prior to admission? : No Thoughts of Harm to Others: No Current Homicidal Intent: No Current Homicidal Plan: No Access to Homicidal Means: No Identified Victim: None History of harm to others?: No Assessment of Violence: None Noted Violent Behavior Description: Pt denies history of violence Does patient have access to weapons?: No Criminal Charges Pending?: No Does patient have a court date: No Is patient on probation?: No  Psychosis Hallucinations: None noted Delusions: None noted  Mental Status Report Appearance/Hygiene: In scrubs Eye Contact: Good Motor Activity: Unremarkable Speech: Logical/coherent Level of Consciousness: Alert Mood: Euthymic Affect: Appropriate to circumstance Anxiety Level: Minimal Thought Processes: Coherent, Relevant Judgement: Partial Orientation: Person, Place, Time, Situation Obsessive Compulsive Thoughts/Behaviors: None  Cognitive Functioning Concentration: Normal Memory: Recent Intact, Remote Intact Is patient IDD: No Insight: Fair Impulse Control: Fair Appetite: Fair Have you had any weight changes? : No Change Sleep: Decreased Total Hours of Sleep: 6 Vegetative Symptoms: None  ADLScreening [facility]) Patient's cognitive ability adequate to safely complete daily activities?: Yes Patient able to express need for assistance with ADLs?: Yes Independently performs ADLs?: Yes (appropriate for developmental age)  Prior Inpatient Therapy Prior Inpatient Therapy: No  Prior Outpatient Therapy Prior Outpatient Therapy: Yes Prior Therapy Dates: Current Prior Therapy Facilty/Provider(s): Dr. Cornell Barman Reason for Treatment: Depression Does patient have an ACCT team?: No Does patient have Intensive In-House Services?  : No Does patient have [facility] services? : No Does patient have P4CC services?: No  ADL Screening  (condition at time of admission) Patient's cognitive ability adequate to safely complete daily activities?: Yes Is the patient deaf or have difficulty hearing?: No Does the patient have difficulty seeing, even when wearing glasses/contacts?: No Does the patient have difficulty concentrating, remembering, or making decisions?: No Patient able to express need for assistance with ADLs?: Yes Does the patient have difficulty dressing or bathing?: No Independently performs ADLs?: Yes (appropriate for developmental age) Does the patient have difficulty walking or climbing stairs?: No Weakness of Legs: None Weakness of Arms/Hands: None  Home Assistive Devices/Equipment Home Assistive Devices/Equipment: None  Therapy Consults (therapy consults require a physician order) PT Evaluation Needed: No OT Evalulation Needed: No SLP Evaluation Needed: No Abuse/Neglect Assessment (Assessment to be complete while patient is alone) Abuse/Neglect Assessment Can Be Completed: Yes Physical Abuse: Denies Verbal Abuse: Denies Sexual Abuse: Denies Exploitation of patient/patient's resources: Denies Self-Neglect: Denies Values / Beliefs Cultural Requests During Hospitalization: None Spiritual Requests During Hospitalization: None Consults Spiritual Care Consult Needed: No Transition of Care Team Consult Needed: No Advance Directives (For Healthcare) Does Patient Have a Medical Advance Directive?: No Would patient like information on creating a medical advance directive?: No - Patient declined          Disposition: Lavell Luster, [facility] at [facility], confirmed adult unit is currently at capacity. Gave  clinical report to Caroline Sauger, NP who said Pt meets criteria for inpatient psychiatric treatment. TTS will contact other facilities for placement. Notified Dr Christy Gentles of recommendation.  Disposition Initial Assessment Completed for this Encounter: Yes  This service was provided via telemedicine  using a 2-way, interactive audio and video technology.  Names of all persons participating in this telemedicine service and their role in this encounter. Name: Vonna Kotyk [facility] Role: Patient  Name: Kyle Nolan Role: Petitioner  Name: Storm Frisk, [facility] Role: TTS counselor      Orpah Greek Anson Fret, [facility], [facility] Triage Specialist [phone]  Evelena Peat 05/17/2020 10:36 PM

## 2020-05-17 NOTE — ED Notes (Signed)
Pt changed into purple scrubs and belongings collected.  Wanded by security.

## 2020-05-17 NOTE — ED Provider Notes (Addendum)
[facility] DEPT Provider Note   CSN: [id number] Arrival date & time: 05/17/20  1511     History Chief Complaint  Patient presents with  . IVC    Kyle Nolan is a 49 y.o. male.  HPI   Pt presents via police after being placed on IVC by his girlfriend.  Per paperwork, pt has history of depression.  He has not been taking his medications.  Pt has not been sleeping or eating well.  He has not been taking care of his hygeine.  Paperwork indicated pt has stated multiple times he would kill himself and it would be clean.  IVC paperwork also states pt has sent goodbye videos.  Pt denies saying anything about suicide.  Pt states he sent videos to kids about him leaving but he states it was because he had the mother of his children are separating and he would be leaving, not that he was going to kill himself.  Past Medical History:  Diagnosis Date  . Allergy    seasonal  . Anxiety 06/20/2013  . Chest pain, unspecified 06/20/2013  . Depression   . Insomnia 06/20/2013  . Medication side effect 07/22/2013  . Mouth lesion 03/12/2012  . Obesity (BMI 30.0-34.9) 03/02/2018  . Routine general medical examination at a health care facility 12/16/2013  . Snuff user 03/12/2012    Patient Active Problem List   Diagnosis Date Noted  . Overweight (BMI 25.0-29.9) 09/09/2019  . Routine general medical examination at a health care facility 12/16/2013  . Medication side effect 07/22/2013  . Anxiety 06/20/2013  . Chest pain, unspecified 06/20/2013  . Insomnia 06/20/2013  . Snuff user 03/12/2012  . Mouth lesion 03/12/2012    Past Surgical History:  Procedure Laterality Date  . WISDOM TOOTH EXTRACTION         Family History  Problem Relation Age of Onset  . Hyperlipidemia Mother   . Hypertension Mother   . Hyperlipidemia Father   . Hypertension Father   . Depression Father   . OCD Father   . Cancer Maternal Uncle   . Hyperlipidemia Maternal Grandmother   .  Hypertension Maternal Grandmother   . Cancer Maternal Grandfather   . Hyperlipidemia Maternal Grandfather   . Hypertension Maternal Grandfather   . Cancer Paternal Grandmother   . Hyperlipidemia Paternal Grandmother   . Hypertension Paternal Grandmother   . Cancer Paternal Grandfather   . Hyperlipidemia Paternal Grandfather   . Hypertension Paternal Grandfather   . Diabetes Paternal Grandfather   . Colon cancer Neg Hx   . Colon polyps Neg Hx   . Esophageal cancer Neg Hx   . Rectal cancer Neg Hx   . Stomach cancer Neg Hx     Social History   Tobacco Use  . Smoking status: Never Smoker  . Smokeless tobacco: Current User    Types: Snuff  Vaping Use  . Vaping Use: Never used  Substance Use Topics  . Alcohol use: Yes    Comment: rarely  . Drug use: Yes    Frequency: 1.0 times per week    Types: Cocaine, Marijuana, Benzodiazepines    Comment: +THC, +Coc, +Benzos    Home Medications Prior to Admission medications   Medication Sig Start Date End Date Taking? Authorizing Provider  ALPRAZolam Duanne Moron) 0.5 MG tablet 1/2-1 tab twice daily PRN sever anxiety 01/30/20   Midge Minium, MD  busPIRone (BUSPAR) 7.5 MG tablet Take 1 tablet (7.5 mg total) by mouth 2 (two) times  daily. 01/30/20   Midge Minium, MD  fenofibrate 160 MG tablet Take 1 tablet (160 mg total) by mouth daily. 01/30/20   Midge Minium, MD  FLUoxetine (PROZAC) 40 MG capsule Take 1 capsule (40 mg total) by mouth daily. 01/30/20   Midge Minium, MD  fluticasone (FLONASE) 50 MCG/ACT nasal spray Place 2 sprays into both nostrils daily. 01/30/20   Midge Minium, MD    Allergies    Patient has no known allergies.  Review of Systems   Review of Systems  All other systems reviewed and are negative.   Physical Exam Updated Vital Signs BP (!) 140/95 (BP Location: Left Arm)   Pulse 91   Temp 97.6 F (36.4 C) (Oral)   Resp 20   SpO2 100%   Physical Exam Vitals and nursing note reviewed.    Constitutional:      General: He is not in acute distress.    Appearance: He is well-developed.  HENT:     Head: Normocephalic and atraumatic.     Right Ear: External ear normal.     Left Ear: External ear normal.  Eyes:     General: No scleral icterus.       Right eye: No discharge.        Left eye: No discharge.     Conjunctiva/sclera: Conjunctivae normal.  Neck:     Trachea: No tracheal deviation.  Cardiovascular:     Rate and Rhythm: Normal rate and regular rhythm.  Pulmonary:     Effort: Pulmonary effort is normal. No respiratory distress.     Breath sounds: Normal breath sounds. No stridor. No wheezing or rales.  Abdominal:     General: Bowel sounds are normal. There is no distension.     Palpations: Abdomen is soft.     Tenderness: There is no abdominal tenderness. There is no guarding or rebound.  Musculoskeletal:        General: No tenderness.     Cervical back: Neck supple.  Skin:    General: Skin is warm and dry.     Findings: No rash.  Neurological:     Mental Status: He is alert.     Cranial Nerves: No cranial nerve deficit (no facial droop, extraocular movements intact, no slurred speech).     Sensory: No sensory deficit.     Motor: No abnormal muscle tone or seizure activity.     Coordination: Coordination normal.  Psychiatric:        Mood and Affect: Mood is depressed.        Speech: Speech is not rapid and pressured, slurred or tangential.        Behavior: Behavior is withdrawn. Behavior is not aggressive or hyperactive.        Thought Content: Thought content does not include homicidal or suicidal ideation.     ED Results / Procedures / Treatments   Labs (all labs ordered are listed, but only abnormal results are displayed) Labs Reviewed  COMPREHENSIVE METABOLIC PANEL - Abnormal; Notable for the following components:      Result Value   Potassium 3.1 (*)    Glucose, Bld 104 (*)    Alkaline Phosphatase 25 (*)    Total Bilirubin 2.0 (*)    All  other components within normal limits  SALICYLATE LEVEL - Abnormal; Notable for the following components:   Salicylate Lvl <6.6 (*)    All other components within normal limits  ACETAMINOPHEN LEVEL - Abnormal; Notable for the  following components:   Acetaminophen (Tylenol), Serum <10 (*)    All other components within normal limits  SARS CORONAVIRUS 2 BY RT PCR (HOSPITAL ORDER, [place] [facility])  ETHANOL  CBC  RAPID URINE DRUG SCREEN, HOSP PERFORMED    EKG None  Radiology No results found.  Procedures Procedures (including critical care time)  Medications Ordered in ED Medications  alum & mag hydroxide-simeth (MAALOX/MYLANTA) 200-200-20 MG/5ML suspension 30 mL (has no administration in time range)  acetaminophen (TYLENOL) tablet 650 mg (has no administration in time range)  zolpidem (AMBIEN) tablet 5 mg (has no administration in time range)  busPIRone (BUSPAR) tablet 7.5 mg (has no administration in time range)  FLUoxetine (PROZAC) capsule 40 mg (has no administration in time range)    ED Course  I have reviewed the triage vital signs and the nursing notes.  Pertinent labs & imaging results that were available during my care of the patient were reviewed by me and considered in my medical decision making (see chart for details).  Clinical Course as of May 20 1239  Sun May 17, 2020  1757 Labs reviewed. No significant abnormalities.   [JK]  3235 Pt was assessed by TTS.  Plan is for admission.   [JK]    Clinical Course User Index [JK] Dorie Rank, MD   The patient has been placed in psychiatric observation due to the need to provide a safe environment for the patient while obtaining psychiatric consultation and evaluation, as well as ongoing medical and medication management to treat the patient's condition.  The patient has been placed under full IVC at this time.  MDM Rules/Calculators/A&P                          Patient presented to the ED for  evaluation after being involuntarily committed by patient's girlfriend. IVC paperwork indicates patient has made suicidal statements. Patient denies this. He states he has made statements about moving away from his children because he is separating from his girlfriend. Patient statements are not consistent with the IVC paperwork. I will ask TTS to assess the patient for further evaluation and recommendations. Final Clinical Impression(s) / ED Diagnoses Final diagnoses:  Depression, unspecified depression type    Rx / DC Orders ED Discharge Orders    None       Dorie Rank, MD 05/17/20 1800    Dorie Rank, MD 05/19/20 1240

## 2020-05-17 NOTE — ED Triage Notes (Signed)
Pt arrives via police from home. Per IVC papers- GF initiated IVC due to pt making numerous remarks about ending his life. Per IVC papers- Pt has sent "goodbye" videos to family. Pt has not been eating, drinking, or caring for himself properly. Pt has been cooperative with police.

## 2020-05-18 ENCOUNTER — Inpatient Hospital Stay
Admission: RE | Admit: 2020-05-18 | Discharge: 2020-05-20 | DRG: 885 | Disposition: A | Discharge status: Home / Self Care | Payer: PRIVATE HEALTH INSURANCE | Source: Intra-hospital | Attending: Psychiatry | Admitting: Psychiatry

## 2020-05-18 ENCOUNTER — Encounter: Payer: Self-pay | Admitting: Psychiatry

## 2020-05-18 ENCOUNTER — Other Ambulatory Visit: Payer: Self-pay

## 2020-05-18 DIAGNOSIS — F333 Major depressive disorder, recurrent, severe with psychotic symptoms: Secondary | ICD-10-CM | POA: Diagnosis not present

## 2020-05-18 DIAGNOSIS — J309 Allergic rhinitis, unspecified: Secondary | ICD-10-CM | POA: Diagnosis present

## 2020-05-18 DIAGNOSIS — F339 Major depressive disorder, recurrent, unspecified: Secondary | ICD-10-CM

## 2020-05-18 DIAGNOSIS — R45851 Suicidal ideations: Secondary | ICD-10-CM | POA: Diagnosis present

## 2020-05-18 DIAGNOSIS — Z20822 Contact with and (suspected) exposure to covid-19: Secondary | ICD-10-CM | POA: Diagnosis present

## 2020-05-18 DIAGNOSIS — F4001 Agoraphobia with panic disorder: Secondary | ICD-10-CM

## 2020-05-18 DIAGNOSIS — F322 Major depressive disorder, single episode, severe without psychotic features: Secondary | ICD-10-CM | POA: Diagnosis present

## 2020-05-18 DIAGNOSIS — Z818 Family history of other mental and behavioral disorders: Secondary | ICD-10-CM

## 2020-05-18 MED ORDER — MAGNESIUM HYDROXIDE 400 MG/5ML PO SUSP: 30.0000 mL | Freq: Every day | ORAL | Status: DC | PRN

## 2020-05-18 MED ORDER — ZOLPIDEM TARTRATE 5 MG PO TABS: 5.0000 mg | ORAL_TABLET | Freq: Every evening | ORAL | Status: DC | PRN

## 2020-05-18 MED ORDER — BUSPIRONE HCL 5 MG PO TABS
7.5000 mg | ORAL_TABLET | Freq: Two times a day (BID) | ORAL | Status: DC
  Administered 2020-05-19 – 2020-05-20 (×3): 7.5 mg via ORAL
  Filled 2020-05-18 (×3): qty 2

## 2020-05-18 MED ORDER — BUSPIRONE HCL 5 MG PO TABS: 7.5000 mg | ORAL_TABLET | Freq: Two times a day (BID) | ORAL | Status: DC

## 2020-05-18 MED ORDER — FLUOXETINE HCL 20 MG PO CAPS
40.0000 mg | ORAL_CAPSULE | Freq: Every day | ORAL | Status: DC
  Administered 2020-05-19 – 2020-05-20 (×2): 40 mg via ORAL
  Filled 2020-05-18 (×2): qty 2

## 2020-05-18 MED ORDER — ALUM & MAG HYDROXIDE-SIMETH 200-200-20 MG/5ML PO SUSP: 30.0000 mL | Freq: Four times a day (QID) | ORAL | Status: DC | PRN

## 2020-05-18 MED ORDER — ALPRAZOLAM 0.5 MG PO TABS: 0.5000 mg | ORAL_TABLET | Freq: Two times a day (BID) | ORAL | Status: DC | PRN

## 2020-05-18 MED ORDER — ACETAMINOPHEN 325 MG PO TABS: 650.0000 mg | ORAL_TABLET | ORAL | Status: DC | PRN

## 2020-05-18 MED ORDER — FENOFIBRATE 160 MG PO TABS
160.0000 mg | ORAL_TABLET | Freq: Every day | ORAL | Status: DC
  Administered 2020-05-19 – 2020-05-20 (×2): 160 mg via ORAL
  Filled 2020-05-18 (×2): qty 1

## 2020-05-18 MED ORDER — FLUOXETINE HCL 20 MG PO CAPS: 40.0000 mg | ORAL_CAPSULE | Freq: Every day | ORAL | Status: DC

## 2020-05-18 MED ORDER — ACETAMINOPHEN 325 MG PO TABS: 650.0000 mg | ORAL_TABLET | Freq: Four times a day (QID) | ORAL | Status: DC | PRN

## 2020-05-18 MED ORDER — ALPRAZOLAM 0.5 MG PO TABS: 0.5000 mg | ORAL_TABLET | Freq: Every day | ORAL | Status: DC | PRN

## 2020-05-18 MED ORDER — ALUM & MAG HYDROXIDE-SIMETH 200-200-20 MG/5ML PO SUSP: 30.0000 mL | ORAL | Status: DC | PRN

## 2020-05-18 MED ORDER — FLUTICASONE PROPIONATE 50 MCG/ACT NA SUSP
2.0000 | Freq: Every day | NASAL | Status: DC
  Administered 2020-05-19 – 2020-05-20 (×2): 2 via NASAL
  Filled 2020-05-18: qty 16

## 2020-05-18 NOTE — ED Notes (Signed)
Attempted to call report to [facility], they requested that we call back around 8pm. Notified that that is when the patient is set to arrive, still wanted to call back.

## 2020-05-18 NOTE — ED Notes (Signed)
Sheriff will transport to [facility], they should be here around 7:30 pm.

## 2020-05-18 NOTE — Plan of Care (Signed)
Patient new to the unit tonight, hasn't had time to progress  Problem: Education: Goal: Knowledge of [place] [facility] Education information/materials will improve Outcome: Not Progressing Goal: Emotional status will improve Outcome: Not Progressing Goal: Mental status will improve Outcome: Not Progressing Goal: Verbalization of understanding the information provided will improve Outcome: Not Progressing   Problem: Safety: Goal: Periods of time without injury will increase Outcome: Not Progressing   Problem: Education: Goal: Utilization of techniques to improve thought processes will improve Outcome: Not Progressing Goal: Knowledge of the prescribed therapeutic regimen will improve Outcome: Not Progressing   Problem: Safety: Goal: Ability to disclose and discuss suicidal ideas will improve Outcome: Not Progressing Goal: Ability to identify and utilize support systems that promote safety will improve Outcome: Not Progressing

## 2020-05-18 NOTE — BH Assessment (Signed)
[place] Assessment Progress Note Case was staffed with Mallie Darting MD after this writer viewed a video on the patient's phone where patient was "saying goodbye to his family." Patient was observed to be visibily distraught and was crying in that video as he addressed his family. Case was staffed with Mallie Darting MD who recommended a inpatient admission to assist with stabilization.

## 2020-05-18 NOTE — Progress Notes (Signed)
CSW called [employer] to request IVC transport but was unable to reach anyone as it is after 5pm and left a message asking for a return call, but another call will need to be made in the morning.  RN/EDP/CN updated.  CSW will continue to follow for D/C needs.  Alphonse Guild. Noe Pittsley  MSW, LCSW, LCAS, CCS Transitions of Care Clinical Social Worker Care Coordination Department Ph: [phone]

## 2020-05-18 NOTE — Progress Notes (Signed)
CSW received a call from Hos[place], RN at [facility] stating the patient has been offered a bed and has been accepted and that the pt can arrive on 8/23 (today) after 8pm.  The pt's accepting doctor is Dr. Weber Cooks.  The room number will be 304.  The number for report is [phone].  CSW will update RN and EDP.  Alphonse Guild. Spero Gunnels, Latanya Presser, LCAS Clinical Social Worker Ph: [phone]

## 2020-05-18 NOTE — BH Assessment (Addendum)
[place] Assessment Progress Note  Per Myles Lipps, MD this pt requires psychiatric hospitalization at this time.  Pt presents under IVC initiated by pt's ex-girlfriend and upheld by EDP Dorie Rank, MD.  Pt is currently under consideration at [facility].  Final disposition is pending as of this writing.  Jalene Mullet, [employer] Coordinator [phone]

## 2020-05-18 NOTE — Consult Note (Signed)
  Patient is seen and examined.  Patient is a 49 year old male with a past psychiatric history significant for depression as well as anxiety who was placed under involuntary commitment by his ex-girlfriend on 05/17/2020.  According to the involuntary commitment paperwork the patient was not eating, sleeping or taking care of himself.  He also reportedly said "goodbye" videos.  The patient denied this on examination today.  He stated that he had had a break-up, and he was being transferred to a facility in [place], [place].  He stated that he would not see his children in some time because of the mood.  He wanted to move mainly because of the relationship issues.  He stated that his psychiatric medications were prescribed by his primary care or cardiology physician.  He is on alprazolam, buspirone, fluoxetine.  He was somewhat irritable during the examination because he had not received any of his medications.  He stated that everything that is been set is in over reaction.  He denied any previous psychiatric admissions.  He denied any previous self-harm.  Review of his admission laboratories revealed a low potassium at 3.1, liver function enzymes were normal.  CBC was normal.  Acetaminophen was less than 10, salicylate less than 7.  Blood alcohol was less than 10, drug screen was completely negative.  He denied any auditory or visual hallucinations.  He denied any suicidal or homicidal ideation.  We discussed with the patient our concern for self-harm.  He has agreed to allow [place] to take a look at the video and assess the significance of this.  If it does appear to be more of a suicide note that she is single by then we will involuntarily commit him and place him in a facility for treatment of his depression.  In the meantime I would recommend restarting his buspirone, alprazolam and fluoxetine.

## 2020-05-18 NOTE — Progress Notes (Signed)
Patient admitted from [facility] ED - report received from [place], [place]. Patient calm and pleasant during assessment denying SI/HI/AVH, pain, anxiety, and depression. Patient stated, "This is all a misunderstanding, I shouldn't even be here. My girlfriend had me sent here, I hope I can go home tomorrow." Patient given education, support, and encouragement to be active in his treatment plan. Patient refused medication this evening stating, "I just want to sleep, I don't need them tonight." Patient given education, still refused. Patient oriented to the unit and his room. Patient skin check completed with Tiffany, no abnormalities or contraband found. Patient being monitored Q 15 minutes for safety per unit protocol. Patient remains safe on the unit.

## 2020-05-18 NOTE — Tx Team (Signed)
Initial Treatment Plan 05/18/2020 10:16 PM [name] PRK:[id number]    PATIENT STRESSORS: Marital or family conflict Medication change or noncompliance   PATIENT STRENGTHS: Motivation for treatment/growth Supportive family/friends   PATIENT IDENTIFIED PROBLEMS: Depression  Anxiety                   DISCHARGE CRITERIA:  Improved stabilization in mood, thinking, and/or behavior Motivation to continue treatment in a less acute level of care  PRELIMINARY DISCHARGE PLAN: Outpatient therapy Return to previous living arrangement  PATIENT/FAMILY INVOLVEMENT: This treatment plan has been presented to and reviewed with the patient, Kyle Nolan. The patient has been given the opportunity to ask questions and make suggestions.  Mallie Darting, RN 05/18/2020, 10:16 PM

## 2020-05-19 DIAGNOSIS — F322 Major depressive disorder, single episode, severe without psychotic features: Principal | ICD-10-CM

## 2020-05-19 DIAGNOSIS — F4001 Agoraphobia with panic disorder: Secondary | ICD-10-CM

## 2020-05-19 NOTE — BHH Suicide Risk Assessment (Signed)
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  48 year old man with a past history of anxiety was petitioned by his ex-girlfriend on the grounds of believing that he was suicidal.  Patient is denying having any suicidal ideation.  He expresses appropriate plans for the future and clearly has things that he values and is looking forward to.  His younger head he is certainly sad and tearful about his current situation.  Patient did not actually act on trying to harm himself and has been consistent during the evaluation and denying suicidal thoughts  Continued Clinical Symptoms:  Alcohol Use Disorder Identification Test Final Score (AUDIT): 0 The "Alcohol Use Disorders Identification Test", Guidelines for Use in Primary Care, Second Edition.  World Pharmacologist [facility]). Score between 0-7:  no or low risk or alcohol related problems. Score between 8-15:  moderate risk of alcohol related problems. Score between 16-19:  high risk of alcohol related problems. Score 20 or above:  warrants further diagnostic evaluation for alcohol dependence and treatment.   CLINICAL FACTORS:   Depression:   Anhedonia   Musculoskeletal: Strength & Muscle Tone: within normal limits Gait & Station: normal Patient leans: N/A  Psychiatric Specialty Exam: Physical Exam Vitals and nursing note reviewed.  Constitutional:      Appearance: He is well-developed.  HENT:      Head: Normocephalic and atraumatic.  Eyes:     Conjunctiva/sclera: Conjunctivae normal.     Pupils: Pupils are equal, round, and reactive to light.  Cardiovascular:     Heart sounds: Normal heart sounds.  Pulmonary:     Effort: Pulmonary effort is normal.  Abdominal:     Palpations: Abdomen is soft.  Musculoskeletal:        General: Normal range of motion.     Cervical back: Normal range of motion.  Skin:    General: Skin is warm and dry.  Neurological:     General: No focal deficit present.     Mental Status: He is alert.  Psychiatric:        Attention and Perception: Attention normal.        Mood and Affect: Mood is depressed.        Speech: Speech normal.        Behavior: Behavior is cooperative.        Thought Content: Thought content normal.        Cognition and Memory: Cognition normal.        Judgment: Judgment normal.     Review of Systems  Constitutional: Negative.   HENT: Negative.   Eyes: Negative.   Respiratory: Negative.   Cardiovascular: Negative.   Gastrointestinal: Negative.   Musculoskeletal: Negative.   Skin: Negative.   Neurological: Negative.   Psychiatric/Behavioral: Positive for dysphoric mood. Negative for agitation, behavioral problems, confusion, decreased concentration, hallucinations, self-injury, sleep disturbance and suicidal ideas. The patient is not nervous/anxious and is not hyperactive.     Blood pressure 122/88, pulse 82, temperature 98.7 F (37.1 C), temperature source Oral, resp. rate  18, height 5\' 11"  (1.803 m), weight 95.3 kg, SpO2 98 %.Body mass index is 29.29 kg/m.  General Appearance: Casual  Eye Contact:  Good  Speech:  Normal Rate  Volume:  Normal  Mood:  Euthymic  Affect:  Congruent  Thought Process:  Goal Directed  Orientation:  Full (Time, Place, and Person)  Thought Content:  Logical  Suicidal Thoughts:  No  Homicidal Thoughts:  No  Memory:  Immediate;   Fair Recent;   Fair Remote;   Fair  Judgement:  Fair   Insight:  Fair  Psychomotor Activity:  Normal  Concentration:  Concentration: Fair  Recall:  [employer] of Knowledge:  Fair  Language:  Fair  Akathisia:  No  Handed:  Right  AIMS (if indicated):     Assets:  Desire for Improvement Housing Physical Health Resilience  ADL's:  Intact  Cognition:  WNL  Sleep:  Number of Hours: 6.5      COGNITIVE FEATURES THAT CONTRIBUTE TO RISK:  None    SUICIDE RISK:   Minimal: No identifiable suicidal ideation.  Patients presenting with no risk factors but with morbid ruminations; may be classified as minimal risk based on the severity of the depressive symptoms  PLAN OF CARE: Patient will be observed on 15-minute checks.  Continue usual psychiatric medicine.  Engage in individual and group therapy and assessment.  Attended treatment team today.  Reassess tomorrow for possible discharge.  I certify that inpatient services furnished can reasonably be expected to improve the patient's condition.   Alethia Berthold, MD 05/19/2020, 2:06 PM

## 2020-05-19 NOTE — Progress Notes (Signed)
Recreation Therapy Notes  INPATIENT RECREATION THERAPY ASSESSMENT  Patient Details Name: Tysheem Accardo MRN: [id number] DOB: 08/19/1971 Today's Date: 05/19/2020       Information Obtained From: Patient  Able to Participate in Assessment/Interview: Yes  Patient Presentation: Responsive  Reason for Admission (Per Patient): Active Symptoms  Patient Stressors:    Coping Skills:   Talk  Leisure Interests (2+):  [employer], Sports - Baseball, Therapist, music - Camping ([employer])  Frequency of Recreation/Participation: Monthly  Awareness of Community Resources:     [employer]:     Current Use:    If no, Barriers?:    Expressed Interest in [place] of Residence:  [place]  Patient Main Form of Transportation: Musician  Patient Strengths:  Helping others  Patient Identified Areas of Improvement:  Learn from this situation  Patient Goal for Hospitalization:  To go home  Current SI (including self-harm):  No  Current HI:  No  Current AVH: No  Staff Intervention Plan: Group Attendance, Collaborate with Interdisciplinary Treatment Team  Consent to Intern Participation: N/A  Yanessa Hocevar 05/19/2020, 3:45 PM

## 2020-05-19 NOTE — Progress Notes (Signed)
D- Patient alert and oriented. Affect/mood is flat, calm and cooperative. Pt denies SI, HI, AVH, and pain. Pt states that he feels "fine" Pt attended all groups.Pt has been out in the day room and mainly conversates with one other pt.   A- Scheduled medications administered to patient, per MD orders. Support and encouragement provided.  Routine safety checks conducted every 15 minutes.  Patient informed to notify staff with problems or concerns.  R- No adverse drug reactions noted. Patient contracts for safety at this time. Patient compliant with medications and treatment plan. Patient receptive, calm, and cooperative. Patient interacts well with others on the unit.  Patient remains safe at this time.  Collier Bullock RN

## 2020-05-19 NOTE — Tx Team (Addendum)
Interdisciplinary Treatment and Diagnostic Plan Update  05/19/2020 Time of Session: 9:00AM Brolin Dambrosia MRN: [id number]  Principal Diagnosis: <principal problem not specified>  Secondary Diagnoses: Active Problems:   Severe major depression, single episode, without psychotic features ([place])   Current Medications:  Current Facility-Administered Medications  Medication Dose Route Frequency Provider Last Rate Last Admin  . acetaminophen (TYLENOL) tablet 650 mg  650 mg Oral Q4H PRN Clapacs, John T, MD      . ALPRAZolam Duanne Moron) tablet 0.5 mg  0.5 mg Oral BID PRN Clapacs, Madie Reno, MD      . alum & mag hydroxide-simeth (MAALOX/MYLANTA) 200-200-20 MG/5ML suspension 30 mL  30 mL Oral Q4H PRN Clapacs, John T, MD      . busPIRone (BUSPAR) tablet 7.5 mg  7.5 mg Oral BID Clapacs, Madie Reno, MD   7.5 mg at 05/19/20 0823  . fenofibrate tablet 160 mg  160 mg Oral Daily Clapacs, Madie Reno, MD   160 mg at 05/19/20 0823  . FLUoxetine (PROZAC) capsule 40 mg  40 mg Oral Daily Clapacs, Madie Reno, MD   40 mg at 05/19/20 0823  . fluticasone (FLONASE) 50 MCG/ACT nasal spray 2 spray  2 spray Each Nare Daily Clapacs, Madie Reno, MD   2 spray at 05/19/20 [phone]  . magnesium hydroxide (MILK OF MAGNESIA) suspension 30 mL  30 mL Oral Daily PRN Clapacs, John T, MD      . zolpidem (AMBIEN) tablet 5 mg  5 mg Oral QHS PRN Clapacs, Madie Reno, MD       PTA Medications: Medications Prior to Admission  Medication Sig Dispense Refill Last Dose  . ALPRAZolam (XANAX) 0.5 MG tablet 1/2-1 tab twice daily PRN sever anxiety (Patient taking differently: Take 0.5 mg by mouth daily as needed for anxiety. ) 60 tablet 0   . busPIRone (BUSPAR) 7.5 MG tablet Take 1 tablet (7.5 mg total) by mouth 2 (two) times daily. 60 tablet 1   . fenofibrate 160 MG tablet Take 1 tablet (160 mg total) by mouth daily. 90 tablet 1   . FLUoxetine (PROZAC) 40 MG capsule Take 1 capsule (40 mg total) by mouth daily. 90 capsule 1   . fluticasone (FLONASE) 50 MCG/ACT nasal spray  Place 2 sprays into both nostrils daily. (Patient not taking: Reported on 05/17/2020) 48 g 1     Patient Stressors: Marital or family conflict Medication change or noncompliance  Patient Strengths: Motivation for treatment/growth Supportive family/friends  Treatment Modalities: Medication Management, Group therapy, Case management,  1 to 1 session with clinician, Psychoeducation, Recreational therapy.   Physician Treatment Plan for Primary Diagnosis: <principal problem not specified> Long Term Goal(s):     Short Term Goals:    Medication Management: Evaluate patient's response, side effects, and tolerance of medication regimen.  Therapeutic Interventions: 1 to 1 sessions, Unit Group sessions and Medication administration.  Evaluation of Outcomes: Not Met  Physician Treatment Plan for Secondary Diagnosis: Active Problems:   Severe major depression, single episode, without psychotic features ([place])  Long Term Goal(s):     Short Term Goals:       Medication Management: Evaluate patient's response, side effects, and tolerance of medication regimen.  Therapeutic Interventions: 1 to 1 sessions, Unit Group sessions and Medication administration.  Evaluation of Outcomes: Not Met   RN Treatment Plan for Primary Diagnosis: <principal problem not specified> Long Term Goal(s): Knowledge of disease and therapeutic regimen to maintain health will improve  Short Term Goals: Ability to verbalize frustration and anger  appropriately will improve, Ability to demonstrate self-control, Ability to participate in decision making will improve, Ability to disclose and discuss suicidal ideas and Ability to identify and develop effective coping behaviors will improve  Medication Management: RN will administer medications as ordered by provider, will assess and evaluate patient's response and provide education to patient for prescribed medication. RN will report any adverse and/or side effects to  prescribing provider.  Therapeutic Interventions: 1 on 1 counseling sessions, Psychoeducation, Medication administration, Evaluate responses to treatment, Monitor vital signs and CBGs as ordered, Perform/monitor CIWA, COWS, AIMS and Fall Risk screenings as ordered, Perform wound care treatments as ordered.  Evaluation of Outcomes: Not Met   LCSW Treatment Plan for Primary Diagnosis: <principal problem not specified> Long Term Goal(s): Safe transition to appropriate next level of care at discharge, Engage patient in therapeutic group addressing interpersonal concerns.  Short Term Goals: Engage patient in aftercare planning with referrals and resources, Increase social support, Increase ability to appropriately verbalize feelings, Increase emotional regulation and Facilitate acceptance of mental health diagnosis and concerns  Therapeutic Interventions: Assess for all discharge needs, 1 to 1 time with Social worker, Explore available resources and support systems, Assess for adequacy in community support network, Educate family and significant other(s) on suicide prevention, Complete Psychosocial Assessment, Interpersonal group therapy.  Evaluation of Outcomes: Not Met   Progress in Treatment: Attending groups: Yes. Participating in groups: Yes. Taking medication as prescribed: Yes. Toleration medication: Yes. Family/Significant other contact made: No, will contact:  pt declined SPE contact with collateral. Patient understands diagnosis: Yes. Discussing patient identified problems/goals with staff: Yes. Medical problems stabilized or resolved: Yes. Denies suicidal/homicidal ideation: Yes. Issues/concerns per patient self-inventory: No. Other: none  New problem(s) identified: No, Describe:  none  New Short Term/Long Term Goal(s):  medication management for mood stabilization; elimination of SI thoughts; development of comprehensive mental wellness   Patient Goals:  "I just want to go  home"  Discharge Plan or Barriers: Patient reports that he is open to aftercare. Patient reports plans to return to his home at discharge.   Reason for Continuation of Hospitalization: Aggression Anxiety Depression Medication stabilization Suicidal ideation  Estimated Length of Stay:  1-7 days  Recreational Therapy: Patient Stressors: N/A Patient Goal: Patient will engage in groups without prompting or encouragement from LRT x3 group sessions within 5 recreation therapy group sessions.  Attendees: Patient: Jamario Colina 05/19/2020 1:06 PM  Physician: Dr. Weber Cooks, MD 05/19/2020 1:06 PM  Nursing: Collier Bullock, RN 05/19/2020 1:06 PM  RN Care Manager: 05/19/2020 1:06 PM  Social Worker: Assunta Curtis, LCSW 05/19/2020 1:06 PM  Recreational Therapist: Roanna Epley, Reather Converse, LRT 05/19/2020 1:06 PM  Other:  05/19/2020 1:06 PM  Other:  05/19/2020 1:06 PM  Other: 05/19/2020 1:06 PM    Scribe for Treatment Team: Rozann Lesches, LCSW 05/19/2020 1:06 PM

## 2020-05-19 NOTE — Plan of Care (Signed)
Pt denies depression, anxiety, SI, HI and AVH. Pt was educated on care plan and verbalizes understanding. Pt was encouraged to attend groups. Collier Bullock RN Problem: Education: Goal: Knowledge of [place] [facility] Education information/materials will improve Outcome: Progressing Goal: Emotional status will improve Outcome: Progressing Goal: Mental status will improve Outcome: Progressing Goal: Verbalization of understanding the information provided will improve Outcome: Progressing   Problem: Safety: Goal: Periods of time without injury will increase Outcome: Progressing   Problem: Education: Goal: Utilization of techniques to improve thought processes will improve Outcome: Progressing Goal: Knowledge of the prescribed therapeutic regimen will improve Outcome: Progressing   Problem: Safety: Goal: Ability to disclose and discuss suicidal ideas will improve Outcome: Progressing Goal: Ability to identify and utilize support systems that promote safety will improve Outcome: Progressing

## 2020-05-19 NOTE — H&P (Signed)
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  49 year old man with a history of past anxiety who was brought to the hospital under IVC filed by his ex-girlfriend.  IVC reports are concerned that he was making suicidal statements or indicating suicidal thinking.  Patient admits that he has been emotional for him the last few months.  He and his girlfriend have been together for several years and have 2 young children together.  They have broken up the last few months and he feels down and lonely.  He is planning now on relocating with his work because he feels like this would help him to move on and recover.  He sent some videos to his ex-girlfriend and their children saying "goodbye".  Girlfriend felt that these represented suicidal ideation but the patient insists that he is not suicidal and had never thought of killing himself.  He claims that he feels like his health is pretty good.  Eating and sleeping okay.  Denies psychotic symptoms.  He is compliant with medication for his history of anxiety and depression.  He does not drink alcohol and does not use any drugs.  Telling the story to me the patient gets tearful appropriately but seems in good control of it.  He clearly indicates that he wants to be with his 2 young children but feels like it would be better if he could have them for extended periods of time on his own rather than confusing them by being around half the time here.   To him this makes a lot of sense. Associated Signs/Symptoms: Depression Symptoms:  depressed mood, anxiety, (Hypo) Manic Symptoms:  None reported Anxiety Symptoms:  Excessive Worry, Panic Symptoms, Psychotic Symptoms:  None reported PTSD Symptoms: Negative Total Time spent with patient: 1 hour  Past Psychiatric History: No previous hospitalizations.  No history of suicide attempts.  He has been prescribed Prozac BuSpar and as needed Xanax by his primary care doctor for treating what sounds like panic attacks from several years ago.  No longer has panic attacks.  Is the patient at risk to self? No.  Has the patient been a risk to self in the past 6 months? No.  Has the patient been a risk to self within the distant past? No.  Is the patient a risk to others? No.  Has the patient been a risk to others in the past 6 months? No.  Has the patient been a risk to others within the distant past? No.   Prior Inpatient Therapy:   Prior Outpatient Therapy:    Alcohol Screening: 1. How often do you have a drink containing alcohol?: Never 2. How many drinks containing alcohol do you have on a typical day when you are drinking?: 1 or 2 3. How often do you have six or more drinks on one occasion?: Never AUDIT-C Score: 0 4. How often during the last year have you found that you were not able to stop drinking once you had started?: Never 5. How often during the last year have you failed to do what was normally expected  from you because of drinking?: Never 6. How often during the last year have you needed a first drink in the morning to get yourself going after a heavy drinking session?: Never 7. How often during the last year have you had a feeling of guilt of remorse after drinking?: Never 8. How often during the last year have you been unable to remember what happened the night before because you had been drinking?: Never 9. Have you or someone else been injured as a result of your drinking?:  No 10. Has a relative or friend or a doctor or another health worker been concerned about your drinking or suggested you cut down?: No Alcohol Use Disorder Identification Test Final Score (AUDIT): 0 Alcohol Brief Interventions/Follow-up: AUDIT Score <7 follow-up not indicated Substance Abuse History in the last 12 months:  No. Consequences of Substance Abuse: Negative Previous Psychotropic Medications: Yes  Psychological Evaluations: Yes  Past Medical History:  Past Medical History:  Diagnosis Date   Allergy    seasonal   Anxiety 06/20/2013   Chest pain, unspecified 06/20/2013   Depression    Insomnia 06/20/2013   Medication side effect 07/22/2013   Mouth lesion 03/12/2012   Obesity (BMI 30.0-34.9) 03/02/2018   Routine general medical examination at a health care facility 12/16/2013   Snuff user 03/12/2012    Past Surgical History:  Procedure Laterality Date   WISDOM TOOTH EXTRACTION     Family History:  Family History  Problem Relation Age of Onset   Hyperlipidemia Mother    Hypertension Mother    Hyperlipidemia Father    Hypertension Father    Depression Father    OCD Father    Cancer Maternal Uncle    Hyperlipidemia Maternal Grandmother    Hypertension Maternal Grandmother    Cancer Maternal Grandfather    Hyperlipidemia Maternal Grandfather    Hypertension Maternal Grandfather    Cancer Paternal Grandmother    Hyperlipidemia Paternal Grandmother    Hypertension Paternal Grandmother    Cancer Paternal Grandfather    Hyperlipidemia Paternal Grandfather    Hypertension Paternal Grandfather    Diabetes Paternal Grandfather    Colon cancer Neg Hx    Colon polyps Neg Hx    Esophageal cancer Neg Hx    Rectal cancer Neg Hx    Stomach cancer Neg Hx    Family Psychiatric  History: Patient denies any family history of significant mental health problems or suicide attempts Tobacco Screening: Have you used any form of tobacco in the last 30  days? (Cigarettes, Smokeless Tobacco, Cigars, and/or Pipes): Yes Tobacco use, Select all that apply: smokeless tobacco use daily Are you interested in Tobacco Cessation Medications?: No, patient refused Counseled patient on smoking cessation including recognizing danger situations, developing coping skills and basic information about quitting provided: Refused/Declined practical counseling Social History:  Social History   Substance and Sexual Activity  Alcohol Use Yes   Comment: rarely     Social History   Substance and Sexual Activity  Drug Use Yes   Frequency: 1.0 times per week   Types: Cocaine, Marijuana, Benzodiazepines   Comment: +THC, +Coc, +Benzos    Additional Social History: Marital status: Single Does patient have children?: Yes How many children?: 5 How is patient's relationship with their children?: Pt reports that he has 5 children, 3 are grown, 2 are "babies".  He reports that the relationship is "fine".  Allergies:  No Known Allergies Lab Results:  Results for orders placed or performed during the hospital encounter of 05/17/20 (from the past 48 hour(s))  Comprehensive metabolic panel     Status: Abnormal   Collection Time: 05/17/20  4:00 PM  Result Value Ref Range   Sodium 141 135 - 145 mmol/L   Potassium 3.1 (L) 3.5 - 5.1 mmol/L   Chloride 106 98 - 111 mmol/L   CO2 22 22 - 32 mmol/L   Glucose, Bld 104 (H) 70 - 99 mg/dL    Comment: Glucose reference range applies only to samples taken after fasting for at least 8 hours.   BUN 13 6 - 20 mg/dL   Creatinine, Ser 1.13 0.61 - 1.24 mg/dL   Calcium 9.3 8.9 - 10.3 mg/dL   Total Protein 7.5 6.5 - 8.1 g/dL   Albumin 4.6 3.5 - 5.0 g/dL   AST 24 15 - 41 U/L   ALT 27 0 - 44 U/L   Alkaline Phosphatase 25 (L) 38 - 126 U/L   Total Bilirubin 2.0 (H) 0.3 - 1.2 mg/dL   GFR calc non Af Amer >60 >60 mL/min   GFR calc Af Amer >60 >60 mL/min   Anion gap 13 5 - 15    Comment: Performed at  [facility], [place] [address] [place], [place] [address]  Ethanol     Status: None   Collection Time: 05/17/20  4:00 PM  Result Value Ref Range   Alcohol, Ethyl (B) <10 <10 mg/dL    Comment: (NOTE) Lowest detectable limit for serum alcohol is 10 mg/dL.  For medical purposes only. Performed at [facility], [place] [address] [place], [place] [address]   Salicylate level     Status: Abnormal   Collection Time: 05/17/20  4:00 PM  Result Value Ref Range   Salicylate Lvl <1.5 (L) 7.0 - 30.0 mg/dL    Comment: Performed at [facility], [place] [address] [place], [place] [address]  Acetaminophen level     Status: Abnormal   Collection Time: 05/17/20  4:00 PM  Result Value Ref Range   Acetaminophen (Tylenol), Serum <10 (L) 10 - 30 ug/mL    Comment: (NOTE) Therapeutic concentrations vary significantly. A range of 10-30 ug/mL  may be an effective concentration for many patients. However, some  are best treated at concentrations outside of this range. Acetaminophen concentrations >150 ug/mL at 4 hours after ingestion  and >50 ug/mL at 12 hours after ingestion are often associated with  toxic reactions.  Performed at [facility], [place] [address], [place], [place] [address]   cbc     Status: None   Collection Time: 05/17/20  4:00 PM  Result Value Ref Range   WBC 8.3 4.0 - 10.5 K/uL   RBC 4.74 4.22 - 5.81 MIL/uL   Hemoglobin 14.7 13.0 - 17.0 g/dL   HCT 44.1 39 - 52 %   MCV 93.0 80.0 - 100.0 fL   MCH 31.0 26.0 - 34.0 pg   MCHC 33.3 30.0 - 36.0 g/dL   RDW 11.9 11.5 - 15.5 %   Platelets 270 150 - 400 K/uL   nRBC 0.0 0.0 - 0.2 %    Comment: Performed at [facility], [place] [address] [place], [place] [address]  Rapid urine drug screen (hospital performed)     Status: None   Collection Time: 05/17/20  4:00 PM  Result Value Ref Range   Opiates NONE  DETECTED NONE DETECTED   Cocaine NONE DETECTED  NONE DETECTED   Benzodiazepines NONE DETECTED NONE DETECTED   Amphetamines NONE DETECTED NONE DETECTED   Tetrahydrocannabinol NONE DETECTED NONE DETECTED   Barbiturates NONE DETECTED NONE DETECTED    Comment: (NOTE) DRUG SCREEN FOR MEDICAL PURPOSES ONLY.  IF CONFIRMATION IS NEEDED FOR ANY PURPOSE, NOTIFY LAB WITHIN 5 DAYS.  LOWEST DETECTABLE LIMITS FOR URINE DRUG SCREEN Drug Class                     Cutoff (ng/mL) Amphetamine and metabolites    1000 Barbiturate and metabolites    200 Benzodiazepine                 194 Tricyclics and metabolites     300 Opiates and metabolites        300 Cocaine and metabolites        300 THC                            50 Performed at [facility], [place] [address] [place], [place] [address]   SARS Coronavirus 2 by RT PCR (hospital order, performed in [facility] hospital lab) Nasopharyngeal Nasopharyngeal Swab     Status: None   Collection Time: 05/17/20  4:00 PM   Specimen: Nasopharyngeal Swab  Result Value Ref Range   SARS Coronavirus 2 NEGATIVE NEGATIVE    Comment: (NOTE) SARS-CoV-2 target nucleic acids are NOT DETECTED.  The SARS-CoV-2 RNA is generally detectable in upper and lower respiratory specimens during the acute phase of infection. The lowest concentration of SARS-CoV-2 viral copies this assay can detect is 250 copies / mL. A negative result does not preclude SARS-CoV-2 infection and should not be used as the sole basis for treatment or other patient management decisions.  A negative result may occur with improper specimen collection / handling, submission of specimen other than nasopharyngeal swab, presence of viral mutation(s) within the areas targeted by this assay, and inadequate number of viral copies (<250 copies / mL). A negative result must be combined with clinical observations, patient history, and epidemiological information.  Fact Sheet for Patients:    StrictlyIdeas.no  Fact Sheet for Healthcare Providers: BankingDealers.co.za  This test is not yet approved or  cleared by the [place] FDA and has been authorized for detection and/or diagnosis of SARS-CoV-2 by FDA under an Emergency Use Authorization (EUA).  This EUA will remain in effect (meaning this test can be used) for the duration of the COVID-19 declaration under Section 564(b)(1) of the Act, 21 U.S.C. section 360bbb-3(b)(1), unless the authorization is terminated or revoked sooner.  Performed at [facility], [place] [address] [place], [place] [address]     Blood Alcohol level:  Lab Results  Component Value Date   ETH <10 85/63/1497    Metabolic Disorder Labs:  No results found for: HGBA1C, MPG No results found for: PROLACTIN Lab Results  Component Value Date   CHOL 238 (H) 09/09/2019   TRIG (H) 09/09/2019    435.0 Triglyceride is over 400; calculations on Lipids are invalid.   HDL 35.60 (L) 09/09/2019   CHOLHDL 7 09/09/2019   VLDL 49.4 (H) 03/02/2018   LDLCALC 142 (H) 12/16/2013    Current Medications: Current Facility-Administered Medications  Medication Dose Route Frequency Provider Last Rate Last Admin   acetaminophen (TYLENOL) tablet 650 mg  650 mg Oral Q4H PRN Coleson Kant, Madie Reno, MD  ALPRAZolam (XANAX) tablet 0.5 mg  0.5 mg Oral BID PRN Attilio Zeitler, Madie Reno, MD       alum & mag hydroxide-simeth (MAALOX/MYLANTA) 200-200-20 MG/5ML suspension 30 mL  30 mL Oral Q4H PRN Corrie Brannen, Madie Reno, MD       busPIRone (BUSPAR) tablet 7.5 mg  7.5 mg Oral BID Amatullah Christy T, MD   7.5 mg at 05/19/20 7510   fenofibrate tablet 160 mg  160 mg Oral Daily Larsen Dungan, Madie Reno, MD   160 mg at 05/19/20 2585   FLUoxetine (PROZAC) capsule 40 mg  40 mg Oral Daily Daisa Stennis, Madie Reno, MD   40 mg at 05/19/20 0823   fluticasone (FLONASE) 50 MCG/ACT nasal spray 2 spray  2 spray Each Nare Daily Marge Vandermeulen, Madie Reno, MD   2 spray  at 05/19/20 2778   magnesium hydroxide (MILK OF MAGNESIA) suspension 30 mL  30 mL Oral Daily PRN Ethon Wymer, Madie Reno, MD       zolpidem (AMBIEN) tablet 5 mg  5 mg Oral QHS PRN Murl Zogg, Madie Reno, MD       PTA Medications: Medications Prior to Admission  Medication Sig Dispense Refill Last Dose   ALPRAZolam (XANAX) 0.5 MG tablet 1/2-1 tab twice daily PRN sever anxiety (Patient taking differently: Take 0.5 mg by mouth daily as needed for anxiety. ) 60 tablet 0    busPIRone (BUSPAR) 7.5 MG tablet Take 1 tablet (7.5 mg total) by mouth 2 (two) times daily. 60 tablet 1    fenofibrate 160 MG tablet Take 1 tablet (160 mg total) by mouth daily. 90 tablet 1    FLUoxetine (PROZAC) 40 MG capsule Take 1 capsule (40 mg total) by mouth daily. 90 capsule 1    fluticasone (FLONASE) 50 MCG/ACT nasal spray Place 2 sprays into both nostrils daily. (Patient not taking: Reported on 05/17/2020) 48 g 1     Musculoskeletal: Strength & Muscle Tone: within normal limits Gait & Station: normal Patient leans: N/A  Psychiatric Specialty Exam: Physical Exam Constitutional:      Appearance: He is well-developed.  HENT:     Head: Normocephalic and atraumatic.  Eyes:     Conjunctiva/sclera: Conjunctivae normal.     Pupils: Pupils are equal, round, and reactive to light.  Cardiovascular:     Heart sounds: Normal heart sounds.  Pulmonary:     Effort: Pulmonary effort is normal.  Abdominal:     Palpations: Abdomen is soft.  Musculoskeletal:        General: Normal range of motion.     Cervical back: Normal range of motion.  Skin:    General: Skin is warm and dry.  Neurological:     General: No focal deficit present.     Mental Status: He is alert.  Psychiatric:        Attention and Perception: Attention normal.        Mood and Affect: Mood is depressed.        Speech: Speech normal.        Behavior: Behavior normal.        Thought Content: Thought content normal.        Cognition and Memory: Cognition  normal.        Judgment: Judgment normal.     Review of Systems  Constitutional: Negative.   HENT: Negative.   Eyes: Negative.   Respiratory: Negative.   Cardiovascular: Negative.   Gastrointestinal: Negative.   Musculoskeletal: Negative.   Skin: Negative.   Neurological: Negative.   Psychiatric/Behavioral:  Positive for dysphoric mood.    Blood pressure 122/88, pulse 82, temperature 98.7 F (37.1 C), temperature source Oral, resp. rate 18, height 5\' 11"  (1.803 m), weight 95.3 kg, SpO2 98 %.Body mass index is 29.29 kg/m.  General Appearance: Casual  Eye Contact:  Good  Speech:  Clear and Coherent  Volume:  Normal  Mood:  Euthymic  Affect:  Congruent  Thought Process:  Coherent  Orientation:  Full (Time, Place, and Person)  Thought Content:  Logical  Suicidal Thoughts:  No  Homicidal Thoughts:  No  Memory:  Immediate;   Fair Recent;   Fair Remote;   Fair  Judgement:  Fair  Insight:  Fair  Psychomotor Activity:  Normal  Concentration:  Concentration: Fair  Recall:  [employer] of Knowledge:  Fair  Language:  Fair  Akathisia:  No  Handed:  Right  AIMS (if indicated):     Assets:  Desire for Improvement  ADL's:  Intact  Cognition:  WNL  Sleep:  Number of Hours: 6.5    Treatment Plan Summary: Daily contact with patient to assess and evaluate symptoms and progress in treatment, Medication management and Plan Continue current medication.  After talking with the patient I find him convincing that he is not acutely intending suicide.  He appears to be sincere and forthcoming in his expressions of his emotions and his future plans.  He does not appear to be agitated psychotic or in any kind of inappropriate behavior and he is not abusing alcohol and drugs.  Supportive counseling done.  Review of the importance of taking care of himself during this lonely transition.  Reassess tomorrow with possible discharge at that point.  Observation Level/Precautions:  15 minute checks   Laboratory:  UDS  Psychotherapy:    Medications:    Consultations:    Discharge Concerns:    Estimated LOS:  Other:     Physician Treatment Plan for Primary Diagnosis: Severe major depression, single episode, without psychotic features ([place]) Long Term Goal(s): Improvement in symptoms so as ready for discharge  Short Term Goals: Ability to verbalize feelings will improve, Ability to disclose and discuss suicidal ideas and Ability to demonstrate self-control will improve  Physician Treatment Plan for Secondary Diagnosis: Principal Problem:   Severe major depression, single episode, without psychotic features ([place]) Active Problems:   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    Alethia Berthold, MD 8/24/20212:11 PM

## 2020-05-19 NOTE — [facility] Suicide Risk Assessment (Signed)
[place] [place] INPATIENT:  Family/Significant Other Suicide Prevention Education  Suicide Prevention Education:  Patient Refusal for Family/Significant Other Suicide Prevention Education: The patient Kyle Nolan has refused to provide written consent for family/significant other to be provided Family/Significant Other Suicide Prevention Education during admission and/or prior to discharge.  Physician notified.  SPE completed with pt, as pt refused to consent to family contact. SPI pamphlet provided to pt and pt was encouraged to share information with support network, ask questions, and talk about any concerns relating to SPE. Pt denies access to guns/firearms and verbalized understanding of information provided. Mobile Crisis information also provided to pt.    Rozann Lesches 05/19/2020, 12:58 PM

## 2020-05-19 NOTE — Progress Notes (Signed)
Recreation Therapy Notes  Date: 08/24//2021  Time: 9:30 am  Location: Craft room   Behavioral response: Appropriate  Intervention Topic: Relaxation   Discussion/Intervention:  Group content today was focused on relaxation. The group defined relaxation and identified healthy ways to relax. Individuals expressed how much time they spend relaxing. Patients expressed how much their life would be if they did not make time for themselves to relax. The group stated ways they could improve their relaxation techniques in the future.  Individuals participated in the intervention "Time to Relax" where they had a chance to experience different relaxation techniques.  Clinical Observations/Feedback:  Patient came to group late and expressed that he relaxes about 56 hours in one week. He explained that stress sometimes keeps him from relaxing. Individual was social with peers and staff while participating in the intervention. Lua Feng LRT/CTRS         Brealyn Baril 05/19/2020 11:51 AM

## 2020-05-19 NOTE — B[facility] Group Notes (Signed)
[place] [facility] Group Notes:  (Nursing/MHT/Case Management/Adjunct)  Date:  05/19/2020  Time:  10:10 PM  Type of Therapy:  Group Therapy  Participation Level:  Active  Participation Quality:  Appropriate  Affect:  Appropriate  Cognitive:  Alert  Insight:  Good  Engagement in Group:  Engaged and said he feel better and he is moving around more.  Modes of Intervention:  Support  Summary of Progress/Problems:  Kyle Nolan 05/19/2020, 10:10 PM

## 2020-05-19 NOTE — Progress Notes (Signed)
Recreation Therapy Notes  INPATIENT RECREATION TR PLAN  Patient Details Name: Omri Bertran MRN: [id number] DOB: 02-03-1971 Today's Date: 05/19/2020  Rec Therapy Plan Is patient appropriate for Therapeutic Recreation?: Yes Treatment times per week: at least 3 Estimated Length of Stay: 5-7 days TR Treatment/Interventions: Group participation (Comment)  Discharge Criteria    Discharge Summary     Kadelyn Dimascio 05/19/2020, 3:48 PM

## 2020-05-20 MED ORDER — ZOLPIDEM TARTRATE 5 MG PO TABS: 5.0000 mg | ORAL_TABLET | Freq: Every evening | ORAL | 0 refills | Status: DC | PRN

## 2020-05-20 MED ORDER — FLUOXETINE HCL 40 MG PO CAPS
40.0000 mg | ORAL_CAPSULE | Freq: Every day | ORAL | 1 refills | Status: DC
Start: 2020-05-20 — End: 2020-09-30

## 2020-05-20 MED ORDER — FLUTICASONE PROPIONATE 50 MCG/ACT NA SUSP
2.0000 | Freq: Every day | NASAL | 1 refills | Status: AC
Start: 2020-05-20 — End: ?

## 2020-05-20 MED ORDER — FENOFIBRATE 160 MG PO TABS
160.0000 mg | ORAL_TABLET | Freq: Every day | ORAL | 1 refills | Status: DC
Start: 2020-05-20 — End: 2020-11-09

## 2020-05-20 MED ORDER — BUSPIRONE HCL 7.5 MG PO TABS
7.5000 mg | ORAL_TABLET | Freq: Two times a day (BID) | ORAL | 1 refills | Status: DC
Start: 2020-05-20 — End: 2020-12-04

## 2020-05-20 NOTE — Plan of Care (Signed)
  Problem: Group Participation Goal: STG - Patient will engage in groups without prompting or encouragement from LRT x3 group sessions within 5 recreation therapy group sessions Description: STG - Patient will engage in groups without prompting or encouragement from LRT x3 group sessions within 5 recreation therapy group sessions Outcome: Completed/Met

## 2020-05-20 NOTE — Progress Notes (Signed)
Patient is quiet and reserved. He is alert and oriented x4. He denies SI/HI/AH/VH/ anxiety/depression and pain at this encounter. He attended group and is observed interacting well with peers in the milieu.  Patient has no active orders for QHS and declined PRN  medication to aid in sleep. He is safe on the unit with 25 minute saftey checks and informed to contact staff with any concerns.     Cleo Butler-Nicholson, LPN

## 2020-05-20 NOTE — Plan of Care (Signed)
  Problem: Education: Goal: Knowledge of [facility] Education information/materials will improve Outcome: Progressing Goal: Emotional status will improve Outcome: Progressing Goal: Mental status will improve Outcome: Progressing Goal: Verbalization of understanding the information provided will improve Outcome: Progressing   Problem: Safety: Goal: Periods of time without injury will increase Outcome: Progressing   Problem: Education: Goal: Utilization of techniques to improve thought processes will improve Outcome: Progressing Goal: Knowledge of the prescribed therapeutic regimen will improve Outcome: Progressing   Problem: Safety: Goal: Ability to disclose and discuss suicidal ideas will improve Outcome: Progressing Goal: Ability to identify and utilize support systems that promote safety will improve Outcome: Progressing

## 2020-05-20 NOTE — [facility] Suicide Risk Assessment (Signed)
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   HENT: Negative.   Eyes: Negative.   Respiratory: Negative.   Cardiovascular: Negative.   Gastrointestinal: Negative.   Musculoskeletal: Negative.   Skin: Negative.   Neurological: Negative.   Psychiatric/Behavioral: Negative.     Blood pressure 116/79, pulse 61, temperature 97.9 F (36.6 C), temperature source Oral, resp. rate 18, height 5\' 11"  (1.803 m), weight 95.3 kg, SpO2 99 %.Body mass index is 29.29 kg/m.  General Appearance: Casual  Eye Contact::  Fair  Speech:  Clear and [id number]  Volume:  Normal  Mood:  Euthymic  Affect:  Congruent  Thought Process:  Goal Directed  Orientation:  Full (Time, Place, and Person)  Thought Content:  Logical  Suicidal Thoughts:  No  Homicidal Thoughts:  No  Memory:  Immediate;   Fair Recent;   Fair Remote;   Fair  Judgement:  Fair  Insight:  Fair  Psychomotor Activity:  Normal  Concentration:  Fair  Recall:  [employer] of Knowledge:Fair  Language: Fair  Akathisia:  No  Handed:  Right  AIMS (if indicated):     Assets:  Desire for Improvement  Sleep:  Number of Hours: 7.15  Cognition: WNL  ADL's:  Intact   Mental Status Per Nursing Assessment::   On Admission:  NA  Demographic Factors:  Male  Loss Factors: Loss of significant relationship  Historical Factors: NA  Risk Reduction Factors:   Responsible for children under 62 years of age, Sense of  responsibility to family, Employed, Positive social support and Positive coping skills or problem solving skills  Continued Clinical Symptoms:  Depression:   Insomnia  Cognitive Features That Contribute To Risk:  None    Suicide Risk:  Minimal: No identifiable suicidal ideation.  Patients presenting with no risk factors but with morbid ruminations; may be classified as minimal risk based on the severity of the depressive symptoms    Plan Of Care/Follow-up recommendations:  Activity:  Activity as tolerated Diet:  Regular diet Other:  Follow-up with outpatient treatment  Alethia Berthold, MD 05/20/2020, 9:34 AM

## 2020-05-20 NOTE — Discharge Summary (Signed)
Physician Discharge Summary Note  Patient:  Kyle Nolan is an 49 y.o., male MRN:  [id number] DOB:  12-24-1970 Patient phone:  [phone] (home)  Patient address:   [place] [address],  Total Time spent with patient: 1 hour  Date of Admission:  05/18/2020 Date of Discharge: 05/20/2020  Reason for Admission: Admitted after presenting to the hospital under IVC alleging that he had threatened suicide  Principal Problem: Severe major depression, single episode, without psychotic features [facility]) Discharge Diagnoses: Principal Problem:   Severe major depression, single episode, without psychotic features ([place]) Active Problems:   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  No previous suicide attempts no history of hospitalization no history of psychosis  Past Medical History:  Past Medical History:  Diagnosis Date  . Allergy    seasonal  . Anxiety 06/20/2013  . Chest pain, unspecified 06/20/2013  . Depression   . Insomnia 06/20/2013  . Medication side effect 07/22/2013  . Mouth lesion 03/12/2012  . Obesity (BMI 30.0-34.9) 03/02/2018  . Routine general medical examination at a health care facility 12/16/2013  . Snuff user 03/12/2012    Past Surgical History:  Procedure Laterality Date  . WISDOM TOOTH EXTRACTION     Family History:  Family History  Problem Relation Age of Onset  . Hyperlipidemia Mother   . Hypertension Mother   . Hyperlipidemia Father   . Hypertension Father   . Depression Father   . OCD Father   . Cancer Maternal Uncle   . Hyperlipidemia Maternal Grandmother   . Hypertension Maternal Grandmother   . Cancer Maternal Grandfather   . Hyperlipidemia Maternal Grandfather   . Hypertension Maternal Grandfather   . Cancer Paternal Grandmother   . Hyperlipidemia Paternal Grandmother   . Hypertension Paternal Grandmother   . Cancer Paternal Grandfather   .  Hyperlipidemia Paternal Grandfather   . Hypertension Paternal Grandfather   . Diabetes Paternal Grandfather   . Colon cancer Neg Hx   . Colon polyps Neg Hx   . Esophageal cancer Neg Hx   . Rectal cancer Neg Hx   . Stomach cancer Neg Hx    Family Psychiatric  History: Denies Social History:  Social History   Substance and Sexual Activity  Alcohol Use Yes   Comment: rarely     Social History   Substance and Sexual Activity  Drug Use Yes  . Frequency: 1.0 times per week  . Types: Cocaine, Marijuana, Benzodiazepines   Comment: +THC, +Coc, +Benzos    Social History   Socioeconomic History  . Marital status: Single    Spouse name: Not on file  . Number of children: Not on file  . Years of education: Not on file  . Highest education level: Not on file  Occupational History  . Occupation: Unemployed  Tobacco Use  . Smoking status: Never Smoker  . Smokeless tobacco: Current User    Types: Snuff  Vaping Use  . Vaping Use: Never used  Substance and Sexual Activity  . Alcohol use: Yes    Comment: rarely  . Drug use: Yes    Frequency: 1.0 times per week    Types: Cocaine, Marijuana, Benzodiazepines    Comment: +THC, +Coc, +Benzos  . Sexual activity: Yes  Other Topics Concern  . Not on file  Social History Narrative   Pt is homeless, unemployed, is not followed by outpatient psychiatrist.   Social Determinants  of Health   Financial Resource Strain:   . Difficulty of Paying Living Expenses: Not on file  Food Insecurity:   . Worried About Charity fundraiser in the Last Year: Not on file  . Ran Out of Food in the Last Year: Not on file  Transportation Needs:   . Lack of Transportation (Medical): Not on file  . Lack of Transportation (Non-Medical): Not on file  Physical Activity:   . Days of Exercise per Week: Not on file  . Minutes of Exercise per Session: Not on file  Stress:   . Feeling of Stress : Not on file  Social Connections:   . Frequency of Communication  with Friends and Family: Not on file  . Frequency of Social Gatherings with Friends and Family: Not on file  . Attends Religious Services: Not on file  . Active Member of Clubs or Organizations: Not on file  . Attends Archivist Meetings: Not on file  . Marital Status: Not on file    Hospital Course: Kept on 15-minute checks.  Patient showed no dangerous aggressive violent or suicidal behavior.  He was cooperative with treatment planning.  He was cooperative with continuing antidepressant medication and antianxiety medicine as previously prescribed.  Patient was consistent in his history that he was not having suicidal thoughts.  He described positive feelings towards his children in particular and plans for how he wanted to continue to be part of their life.  Patient attended groups and appeared appropriate and sincere in his activities.  No change to medication.  At the time of discharge did not appear to be meeting commitment criteria and was fully agreeable to referral for outpatient therapy.  Physical Findings: AIMS:  , ,  ,  ,    CIWA:    COWS:     Musculoskeletal: Strength & Muscle Tone: within normal limits Gait & Station: normal Patient leans: N/A  Psychiatric Specialty Exam: Physical Exam Vitals and nursing note reviewed.  Constitutional:      Appearance: He is well-developed.  HENT:     Head: Normocephalic and atraumatic.  Eyes:     Conjunctiva/sclera: Conjunctivae normal.     Pupils: Pupils are equal, round, and reactive to light.  Cardiovascular:     Heart sounds: Normal heart sounds.  Pulmonary:     Effort: Pulmonary effort is normal.  Abdominal:     Palpations: Abdomen is soft.  Musculoskeletal:        General: Normal range of motion.     Cervical back: Normal range of motion.  Skin:    General: Skin is warm and dry.  Neurological:     General: No focal deficit present.     Mental Status: He is alert.  Psychiatric:        Attention and Perception:  Attention normal.        Mood and Affect: Mood normal.        Speech: Speech normal.        Behavior: Behavior normal.        Thought Content: Thought content normal.        Cognition and Memory: Cognition normal.        Judgment: Judgment normal.     Review of Systems  Constitutional: Negative.   HENT: Negative.   Eyes: Negative.   Respiratory: Negative.   Cardiovascular: Negative.   Gastrointestinal: Negative.   Musculoskeletal: Negative.   Skin: Negative.   Neurological: Negative.   Psychiatric/Behavioral: Negative.  Blood pressure 116/79, pulse 61, temperature 97.9 F (36.6 C), temperature source Oral, resp. rate 18, height 5\' 11"  (1.803 m), weight 95.3 kg, SpO2 99 %.Body mass index is 29.29 kg/m.  General Appearance: Casual  Eye Contact:  Good  Speech:  Clear and Coherent  Volume:  Normal  Mood:  Euthymic  Affect:  Congruent  Thought Process:  Coherent  Orientation:  Full (Time, Place, and Person)  Thought Content:  Logical  Suicidal Thoughts:  No  Homicidal Thoughts:  No  Memory:  Immediate;   Fair Recent;   Fair Remote;   Fair  Judgement:  Fair  Insight:  Fair  Psychomotor Activity:  Normal  Concentration:  Concentration: Poor  Recall:  [employer] of Knowledge:  Fair  Language:  Fair  Akathisia:  No  Handed:  Right  AIMS (if indicated):     Assets:  Desire for Improvement  ADL's:  Intact  Cognition:  WNL  Sleep:  Number of Hours: 7.15     Have you used any form of tobacco in the last 30 days? (Cigarettes, Smokeless Tobacco, Cigars, and/or Pipes): Yes  Has this patient used any form of tobacco in the last 30 days? (Cigarettes, Smokeless Tobacco, Cigars, and/or Pipes) Yes, No  Blood Alcohol level:  Lab Results  Component Value Date   ETH <10 47/05/6282    Metabolic Disorder Labs:  No results found for: HGBA1C, MPG No results found for: PROLACTIN Lab Results  Component Value Date   CHOL 238 (H) 09/09/2019   TRIG (H) 09/09/2019    435.0  Triglyceride is over 400; calculations on Lipids are invalid.   HDL 35.60 (L) 09/09/2019   CHOLHDL 7 09/09/2019   VLDL 49.4 (H) 03/02/2018   LDLCALC 142 (H) 12/16/2013    See Psychiatric Specialty Exam and Suicide Risk Assessment completed by Attending Physician prior to discharge.  Discharge destination:  Home  Is patient on multiple antipsychotic therapies at discharge:  No   Has Patient had three or more failed trials of antipsychotic monotherapy by history:  No  Recommended Plan for Multiple Antipsychotic Therapies: NA  Discharge Instructions    Diet - low sodium heart healthy   Complete by: As directed    Increase activity slowly   Complete by: As directed      Allergies as of 05/20/2020   No Known Allergies     Medication List    TAKE these medications     Indication  ALPRAZolam 0.5 MG tablet Commonly known as: Xanax 1/2-1 tab twice daily PRN sever anxiety What changed:   how much to take  how to take this  when to take this  reasons to take this  additional instructions  Indication: Feeling Anxious   busPIRone 7.5 MG tablet Commonly known as: BUSPAR Take 1 tablet (7.5 mg total) by mouth 2 (two) times daily.  Indication: Major Depressive Disorder   fenofibrate 160 MG tablet Take 1 tablet (160 mg total) by mouth daily.  Indication: High Amount of Fats in the Blood   FLUoxetine 40 MG capsule Commonly known as: PROZAC Take 1 capsule (40 mg total) by mouth daily.  Indication: Depression, Panic Disorder   fluticasone 50 MCG/ACT nasal spray Commonly known as: FLONASE Place 2 sprays into both nostrils daily.  Indication: Allergic Rhinitis   zolpidem 5 MG tablet Commonly known as: AMBIEN Take 1 tablet (5 mg total) by mouth at bedtime as needed for sleep.  Indication: Trouble Sleeping  Follow-up Information    [facility] Follow up.   Why: Your appointment is scheudled for 05/28/2020 at [place].  Thanks! Contact  information: [place], [place] Phone: [phone] Fax: [phone]              Follow-up recommendations:  Activity:  Activity as tolerated Diet:  Regular diet Other:  Follow-up with outpatient mental health counseling and medication  Comments: Prescriptions provided at discharge  Signed: Alethia Berthold, MD 05/20/2020, 2:43 PM

## 2020-05-20 NOTE — Progress Notes (Signed)
  [facility] Adult Case Management Discharge Plan :  Will you be returning to the same living situation after discharge:  Yes,  pt reports that he is returning home.  At discharge, do you have transportation home?: Yes,  pt reports that a peer will pick him up.  Do you have the ability to pay for your medications: Yes,  [employer]  Release of information consent forms completed and in the chart;  Patient's signature needed at discharge.  Patient to Follow up at:  Follow-up Information    [facility] Follow up.   Why: Your appointment is scheudled for 05/28/2020 at [facility].  Thanks! Contact information: [place], [place] Phone: [phone] Fax: [phone]              Next level of care provider has access to [facility] [place] and Suicide Prevention discussed: No.  Pt declined SPE contact with collaterals.  SPE completed with the patient.   Have you used any form of tobacco in the last 30 days? (Cigarettes, Smokeless Tobacco, Cigars, and/or Pipes): Yes  Has patient been referred to the Quitline?: Patient refused referral  Patient has been referred for addiction treatment: Pt. refused referral  Rozann Lesches, LCSW 05/20/2020, 10:19 AM

## 2020-05-20 NOTE — Progress Notes (Signed)
Pt denies SI, HI and AVH. Pt received belongings, dc packet and prescriptions. Pt was educated on dc plan and verbalizes understanding. Collier Bullock RN

## 2020-05-20 NOTE — Progress Notes (Signed)
Recreation Therapy Notes  INPATIENT RECREATION TR PLAN  Patient Details Name: Braeden Kennan MRN: [id number] DOB: [birth year] Today's Date: 05/20/2020  Rec Therapy Plan Is patient appropriate for Therapeutic Recreation?: Yes Treatment times per week: at least 3 Estimated Length of Stay: 5-7 days TR Treatment/Interventions: Group participation (Comment)  Discharge Criteria Pt will be discharged from therapy if:: Discharged Treatment plan/goals/alternatives discussed and agreed upon by:: Patient/family  Discharge Summary Short term goals set: Patient will engage in groups without prompting or encouragement from LRT x3 group sessions within 5 recreation therapy group sessions Short term goals met: Complete Progress toward goals comments: Groups attended Which groups?: Other (Comment) (Problem Solving, Relaxation) Reason goals not met: N/A Therapeutic equipment acquired: N/A Reason patient discharged from therapy: Discharge from hospital Pt/family agrees with progress & goals achieved: Yes Date patient discharged from therapy: 05/27/20   Layal Javid 05/20/2020, 1:42 PM

## 2020-05-20 NOTE — Plan of Care (Signed)
Pt denies depression, anxiety, SI, HI and AVH.  Pt was educated on care plan and verbalizes understanding. Pt is anticipating dc. Collier Bullock RN Problem: Education: Goal: Knowledge of  General Education information/materials will improve Outcome: Adequate for Discharge Goal: Emotional status will improve Outcome: Adequate for Discharge Goal: Mental status will improve Outcome: Adequate for Discharge Goal: Verbalization of understanding the information provided will improve Outcome: Adequate for Discharge   Problem: Safety: Goal: Periods of time without injury will increase Outcome: Adequate for Discharge   Problem: Education: Goal: Utilization of techniques to improve thought processes will improve Outcome: Adequate for Discharge Goal: Knowledge of the prescribed therapeutic regimen will improve Outcome: Adequate for Discharge   Problem: Safety: Goal: Ability to disclose and discuss suicidal ideas will improve Outcome: Adequate for Discharge Goal: Ability to identify and utilize support systems that promote safety will improve Outcome: Adequate for Discharge

## 2020-05-20 NOTE — Progress Notes (Signed)
Recreation Therapy Notes   Date: 05/20/2020  Time: 9:30 am  Location: Craft room   Behavioral response: Appropriate  Intervention Topic: Problem Solving    Discussion/Intervention:  Group content on today was focused on problem solving. The group described what problem solving is. Patients expressed how problems affect them and how they deal with problems. Individuals identified healthy ways to deal with problems. Patients explained what normally happens to them when they do not deal with problems. The group expressed reoccurring problems for them. The group participated in the intervention "Ways to Solve problems" where patients were given a chance to explore different ways to solve problems.  Clinical Observations/Feedback:  Patient came to group and defined problem solving as taking time to reflect. Individual was social with peers and staff while participating in the intervention. Eshika Reckart LRT/CTRS         Brookes Craine 05/20/2020 1:38 PM

## 2020-07-21 ENCOUNTER — Other Ambulatory Visit: Payer: Self-pay | Admitting: Family Medicine

## 2020-07-21 MED ORDER — ALPRAZOLAM 0.5 MG PO TABS
ORAL_TABLET | ORAL | 0 refills | Status: DC
Start: 2020-07-21 — End: 2020-12-04

## 2020-07-21 NOTE — Telephone Encounter (Signed)
Patient called stating that we had denied his medication refill - please advise -   Medication Refills  Medication: xanax  Ph[facility]:  [facility] [address]  ** Let patient know to contact pharmacy at the end of the day to make sure medication is ready.**  ** Please notify patient to allow 48-72 hours to process.**  ** Encourage patient to contact the pharmacy for refills or they can request refills through [facility]**  Clinical Fills out below:   Last refill:  QTY:  Refill Date:    Other Comments:   Okay for refill?  Please advise.

## 2020-07-21 NOTE — Telephone Encounter (Signed)
Last OV 09/09/2019 Alprazolam last filled 01/30/20 #60 with 0

## 2020-09-14 ENCOUNTER — Encounter: Payer: Self-pay | Admitting: Family Medicine

## 2020-09-14 ENCOUNTER — Other Ambulatory Visit: Payer: Self-pay

## 2020-09-14 ENCOUNTER — Ambulatory Visit (INDEPENDENT_AMBULATORY_CARE_PROVIDER_SITE_OTHER): Payer: No Typology Code available for payment source | Admitting: Family Medicine

## 2020-09-14 VITALS — BP 128/70 | HR 64 | Temp 97.9°F | Resp 20 | Ht 71.0 in | Wt 220.8 lb

## 2020-09-14 DIAGNOSIS — E66811 Obesity, class 1: Secondary | ICD-10-CM

## 2020-09-14 DIAGNOSIS — E669 Obesity, unspecified: Secondary | ICD-10-CM | POA: Diagnosis not present

## 2020-09-14 DIAGNOSIS — Z1159 Encounter for screening for other viral diseases: Secondary | ICD-10-CM

## 2020-09-14 DIAGNOSIS — Z125 Encounter for screening for malignant neoplasm of prostate: Secondary | ICD-10-CM

## 2020-09-14 DIAGNOSIS — Z114 Encounter for screening for human immunodeficiency virus [HIV]: Secondary | ICD-10-CM

## 2020-09-14 DIAGNOSIS — Z0000 Encounter for general adult medical examination without abnormal findings: Secondary | ICD-10-CM

## 2020-09-14 DIAGNOSIS — Z23 Encounter for immunization: Secondary | ICD-10-CM | POA: Diagnosis not present

## 2020-09-14 NOTE — Assessment & Plan Note (Signed)
Deteriorated.  Pt has gained 20lbs since last visit.  Stressed need for healthy diet and regular exercise.  Check labs to risk stratify.

## 2020-09-14 NOTE — Progress Notes (Signed)
   Subjective:    Patient ID: Kyle Nolan, male    DOB: 03/21/1971, 49 y.o.   MRN: [id number]  HPI CPE- UTD on colonoscopy, Tdap, COVID.  Due for flu.  Pt has gained 20 lbs since last visit  Reviewed past medical, surgical, family and social histories.   Health Maintenance  Topic Date Due  . Hepatitis C Screening  Never done  . HIV Screening  Never done  . INFLUENZA VACCINE  04/26/2020  . COLONOSCOPY  10/10/2026  . TETANUS/TDAP  09/08/2029  . COVID-19 Vaccine  Completed      Review of Systems Patient reports no vision/hearing changes, anorexia, fever ,adenopathy, persistant/recurrent hoarseness, swallowing issues, chest pain, palpitations, edema, persistant/recurrent cough, hemoptysis, dyspnea (rest,exertional, paroxysmal nocturnal), gastrointestinal  bleeding (melena, rectal bleeding), abdominal pain, excessive heart burn, GU symptoms (dysuria, hematuria, voiding/incontinence issues) syncope, focal weakness, memory loss, numbness & tingling, skin/hair/nail changes, depression, anxiety, abnormal bruising/bleeding, musculoskeletal symptoms/signs.   This visit occurred during the SARS-CoV-2 public health emergency.  Safety protocols were in place, including screening questions prior to the visit, additional usage of staff PPE, and extensive cleaning of exam room while observing appropriate contact time as indicated for disinfecting solutions.       Objective:   Physical Exam General Appearance:    Alert, cooperative, no distress, appears stated age  Head:    Normocephalic, without obvious abnormality, atraumatic  Eyes:    PERRL, conjunctiva/corneas clear, EOM's intact, fundi    benign, both eyes       Ears:    Normal TM's and external ear canals, both ears  Nose:   Deferred due to COVID  Throat:   Neck:   Supple, symmetrical, trachea midline, no adenopathy;       thyroid:  No enlargement/tenderness/nodules  Back:     Symmetric, no curvature, ROM normal, no CVA tenderness  Lungs:      Clear to auscultation bilaterally, respirations unlabored  Chest wall:    No tenderness or deformity  Heart:    Regular rate and rhythm, S1 and S2 normal, no murmur, rub   or gallop  Abdomen:     Soft, non-tender, bowel sounds active all four quadrants,    no masses, no organomegaly  Genitalia:    deferred  Rectal:    Extremities:   Extremities normal, atraumatic, no cyanosis or edema  Pulses:   2+ and symmetric all extremities  Skin:   Skin color, texture, turgor normal, no rashes or lesions  Lymph nodes:   Cervical, supraclavicular, and axillary nodes normal  Neurologic:   CNII-XII intact. Normal strength, sensation and reflexes      throughout          Assessment & Plan:

## 2020-09-14 NOTE — Assessment & Plan Note (Signed)
Pt's PE WNL w/ exception of obesity.  UTD on colonoscopy, Tdap, COVID.  Flu today.  Check labs.  Anticipatory guidance provided.

## 2020-09-14 NOTE — Patient Instructions (Addendum)
Follow up in 1 year or as needed We'll notify you of your lab results and make any changes if needed Continue to work on healthy diet and regular exercise- you can do it! Call with any questions or concerns Stay Safe!  Stay Healthy! Happy Holidays!!! 

## 2020-09-15 MED ORDER — SILDENAFIL CITRATE 100 MG PO TABS: 50.0000 mg | ORAL_TABLET | Freq: Every day | ORAL | 11 refills | Status: DC | PRN

## 2020-09-30 ENCOUNTER — Other Ambulatory Visit: Payer: Self-pay | Admitting: Family Medicine

## 2020-11-09 ENCOUNTER — Other Ambulatory Visit: Payer: Self-pay

## 2020-11-09 DIAGNOSIS — E785 Hyperlipidemia, unspecified: Secondary | ICD-10-CM

## 2020-11-09 DIAGNOSIS — E1169 Type 2 diabetes mellitus with other specified complication: Secondary | ICD-10-CM

## 2020-11-09 MED ORDER — FENOFIBRATE 160 MG PO TABS: 160.0000 mg | ORAL_TABLET | Freq: Every day | ORAL | 0 refills | Status: DC

## 2020-12-03 ENCOUNTER — Encounter: Payer: Self-pay | Admitting: Family Medicine

## 2020-12-04 ENCOUNTER — Other Ambulatory Visit: Payer: Self-pay | Admitting: Emergency Medicine

## 2020-12-04 DIAGNOSIS — F322 Major depressive disorder, single episode, severe without psychotic features: Secondary | ICD-10-CM

## 2020-12-04 DIAGNOSIS — F419 Anxiety disorder, unspecified: Secondary | ICD-10-CM

## 2020-12-04 MED ORDER — BUSPIRONE HCL 7.5 MG PO TABS: 7.5000 mg | ORAL_TABLET | Freq: Two times a day (BID) | ORAL | 1 refills | Status: DC

## 2020-12-04 MED ORDER — SILDENAFIL CITRATE 100 MG PO TABS: 50.0000 mg | ORAL_TABLET | Freq: Every day | ORAL | 11 refills | Status: DC | PRN

## 2020-12-04 MED ORDER — ALPRAZOLAM 0.5 MG PO TABS: ORAL_TABLET | ORAL | 0 refills | Status: DC

## 2020-12-04 NOTE — Telephone Encounter (Signed)
Refill request sent to PCP for medication refills

## 2020-12-04 NOTE — Telephone Encounter (Signed)
Xanax last rx 07/21/20 #60 LOV: 09/14/20 CPE

## 2021-01-05 ENCOUNTER — Other Ambulatory Visit: Payer: Self-pay

## 2021-01-05 DIAGNOSIS — F419 Anxiety disorder, unspecified: Secondary | ICD-10-CM

## 2021-01-05 MED ORDER — FLUOXETINE HCL 40 MG PO CAPS: 40.0000 mg | ORAL_CAPSULE | Freq: Every day | ORAL | 0 refills | Status: DC

## 2021-02-08 ENCOUNTER — Other Ambulatory Visit: Payer: Self-pay

## 2021-02-08 DIAGNOSIS — F322 Major depressive disorder, single episode, severe without psychotic features: Secondary | ICD-10-CM

## 2021-02-08 MED ORDER — BUSPIRONE HCL 7.5 MG PO TABS: 7.5000 mg | ORAL_TABLET | Freq: Two times a day (BID) | ORAL | 1 refills | Status: DC

## 2021-02-18 ENCOUNTER — Other Ambulatory Visit: Payer: Self-pay | Admitting: Family Medicine

## 2021-02-18 DIAGNOSIS — E1169 Type 2 diabetes mellitus with other specified complication: Secondary | ICD-10-CM

## 2021-03-24 ENCOUNTER — Encounter: Payer: Self-pay | Admitting: *Deleted

## 2021-04-09 ENCOUNTER — Other Ambulatory Visit: Payer: Self-pay

## 2021-04-09 DIAGNOSIS — F419 Anxiety disorder, unspecified: Secondary | ICD-10-CM

## 2021-04-09 MED ORDER — FLUOXETINE HCL 40 MG PO CAPS: 40.0000 mg | ORAL_CAPSULE | Freq: Every day | ORAL | 0 refills | Status: DC

## 2021-05-23 ENCOUNTER — Other Ambulatory Visit: Payer: Self-pay | Admitting: Family Medicine

## 2021-05-23 DIAGNOSIS — E1169 Type 2 diabetes mellitus with other specified complication: Secondary | ICD-10-CM

## 2021-07-14 ENCOUNTER — Other Ambulatory Visit: Payer: Self-pay

## 2021-07-14 DIAGNOSIS — F419 Anxiety disorder, unspecified: Secondary | ICD-10-CM

## 2021-07-14 MED ORDER — FLUOXETINE HCL 40 MG PO CAPS: 40.0000 mg | ORAL_CAPSULE | Freq: Every day | ORAL | 0 refills | Status: DC

## 2021-08-03 ENCOUNTER — Other Ambulatory Visit: Payer: Self-pay | Admitting: Family Medicine

## 2021-08-03 ENCOUNTER — Encounter: Payer: Self-pay | Admitting: Family Medicine

## 2021-08-03 ENCOUNTER — Other Ambulatory Visit: Payer: Self-pay

## 2021-08-03 DIAGNOSIS — F419 Anxiety disorder, unspecified: Secondary | ICD-10-CM

## 2021-08-03 DIAGNOSIS — N529 Male erectile dysfunction, unspecified: Secondary | ICD-10-CM

## 2021-08-03 MED ORDER — SILDENAFIL CITRATE 100 MG PO TABS: 50.0000 mg | ORAL_TABLET | Freq: Every day | ORAL | 11 refills | Status: AC | PRN

## 2021-08-03 NOTE — Telephone Encounter (Signed)
Requesting:Xanax 0.5mg  Contract: UDS: Last Visit:09/15/20 Next Visit:n/a Last Refill:12/04/20 60 tabs 0 reills  Please Advise

## 2021-08-04 MED ORDER — ALPRAZOLAM 0.5 MG PO TABS: ORAL_TABLET | ORAL | 0 refills | Status: DC

## 2021-10-18 ENCOUNTER — Telehealth: Payer: Self-pay | Admitting: Family Medicine

## 2021-10-18 NOTE — Telephone Encounter (Signed)
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 I have placed this upfront with a charge sheet and pt would like a call once this has been faxed to [phone]

## 2021-10-21 NOTE — Telephone Encounter (Signed)
Pt is scheduled °

## 2021-10-22 ENCOUNTER — Encounter: Payer: Self-pay | Admitting: Family Medicine

## 2021-10-22 ENCOUNTER — Ambulatory Visit: Payer: No Typology Code available for payment source | Admitting: Family Medicine

## 2021-10-22 VITALS — BP 136/78 | HR 73 | Temp 98.0°F | Resp 16 | Ht 71.0 in | Wt 213.0 lb

## 2021-10-22 DIAGNOSIS — F322 Major depressive disorder, single episode, severe without psychotic features: Secondary | ICD-10-CM

## 2021-10-22 DIAGNOSIS — Z23 Encounter for immunization: Secondary | ICD-10-CM | POA: Diagnosis not present

## 2021-10-22 DIAGNOSIS — E663 Overweight: Secondary | ICD-10-CM

## 2021-10-22 DIAGNOSIS — Z0000 Encounter for general adult medical examination without abnormal findings: Secondary | ICD-10-CM | POA: Diagnosis not present

## 2021-10-22 DIAGNOSIS — Z125 Encounter for screening for malignant neoplasm of prostate: Secondary | ICD-10-CM

## 2021-10-22 LAB — HEPATIC FUNCTION PANEL
ALT: 32 U/L (ref 0–53)
AST: 19 U/L (ref 0–37)
Albumin: 4.5 g/dL (ref 3.5–5.2)
Alkaline Phosphatase: 27 U/L — ABNORMAL LOW (ref 39–117)
Bilirubin, Direct: 0.2 mg/dL (ref 0.0–0.3)
Total Bilirubin: 1 mg/dL (ref 0.2–1.2)
Total Protein: 7.3 g/dL (ref 6.0–8.3)

## 2021-10-22 LAB — LIPID PANEL
Cholesterol: 242 mg/dL — ABNORMAL HIGH (ref 0–200)
HDL: 46.1 mg/dL (ref >39.00)
LDL Cholesterol: 167 mg/dL — ABNORMAL HIGH (ref 0–99)
NonHDL: 196.34
Total CHOL/HDL Ratio: 5
Triglycerides: 147 mg/dL (ref 0.0–149.0)
VLDL: 29.4 mg/dL (ref 0.0–40.0)

## 2021-10-22 LAB — CBC WITH DIFFERENTIAL/PLATELET
Basophils Absolute: 0.1 10*3/uL (ref 0.0–0.1)
Basophils Relative: 1.1 % (ref 0.0–3.0)
Eosinophils Absolute: 0 10*3/uL (ref 0.0–0.7)
Eosinophils Relative: 0.7 % (ref 0.0–5.0)
HCT: 47.8 % (ref 39.0–52.0)
Hemoglobin: 16.2 g/dL (ref 13.0–17.0)
Lymphocytes Relative: 22 % (ref 12.0–46.0)
Lymphs Abs: 1.5 10*3/uL (ref 0.7–4.0)
MCHC: 33.9 g/dL (ref 30.0–36.0)
MCV: 93.6 fl (ref 78.0–100.0)
Monocytes Absolute: 0.4 10*3/uL (ref 0.1–1.0)
Monocytes Relative: 6.3 % (ref 3.0–12.0)
Neutro Abs: 4.7 10*3/uL (ref 1.4–7.7)
Neutrophils Relative %: 69.9 % (ref 43.0–77.0)
Platelets: 276 10*3/uL (ref 150.0–400.0)
RBC: 5.11 Mil/uL (ref 4.22–5.81)
RDW: 13.1 % (ref 11.5–15.5)
WBC: 6.7 10*3/uL (ref 4.0–10.5)

## 2021-10-22 LAB — BASIC METABOLIC PANEL
BUN: 16 mg/dL (ref 6–23)
CO2: 27 mEq/L (ref 19–32)
Calcium: 9.5 mg/dL (ref 8.4–10.5)
Chloride: 104 mEq/L (ref 96–112)
Creatinine, Ser: 1.2 mg/dL (ref 0.40–1.50)
GFR: 69.63 mL/min (ref >60.00)
Glucose, Bld: 101 mg/dL — ABNORMAL HIGH (ref 70–99)
Potassium: 4 mEq/L (ref 3.5–5.1)
Sodium: 140 mEq/L (ref 135–145)

## 2021-10-22 LAB — TSH: TSH: 0.91 u[IU]/mL (ref 0.35–5.50)

## 2021-10-22 LAB — PSA: PSA: 6.21 ng/mL — ABNORMAL HIGH (ref 0.10–4.00)

## 2021-10-22 NOTE — Assessment & Plan Note (Signed)
Pt's PE WNL w/ exception of BMI.  UTD on colonoscopy, Tdap.  Check labs.  Anticipatory guidance provided.  

## 2021-10-22 NOTE — Progress Notes (Signed)
° °  Subjective:    Patient ID: Kyle Nolan, male    DOB: Feb 11, 1971, 51 y.o.   MRN: [id number]  HPI CPE- UTD on colonoscopy, Tdap.  No concerns today.  Health Maintenance  Topic Date Due   Hepatitis C Screening  Never done   Zoster Vaccines- Shingrix (1 of 2) Never done   COVID-19 Vaccine (4 - Booster for Pfizer series) 09/07/2020   INFLUENZA VACCINE  12/24/2021 (Originally 04/26/2021)   COLONOSCOPY (Pts 45-69yrs Insurance coverage will need to be confirmed)  10/10/2026   TETANUS/TDAP  09/08/2029   HIV Screening  Completed   HPV VACCINES  Aged Out   URINE MICROALBUMIN  Discontinued      Review of Systems Patient reports no vision/hearing changes, anorexia, fever ,adenopathy, persistant/recurrent hoarseness, swallowing issues, chest pain, palpitations, edema, persistant/recurrent cough, hemoptysis, dyspnea (rest,exertional, paroxysmal nocturnal), gastrointestinal  bleeding (melena, rectal bleeding), abdominal pain, excessive heart burn, GU symptoms (dysuria, hematuria, voiding/incontinence issues) syncope, focal weakness, memory loss, numbness & tingling, skin/hair/nail changes, abnormal bruising/bleeding, musculoskeletal symptoms/signs.   + anxiety/depression- pt reports family members had him admitted b/c he was so focused on working and not eating.  Separated from his wife, 'i had a rough time'.  Is still working on custody arrangements.    This visit occurred during the SARS-CoV-2 public health emergency.  Safety protocols were in place, including screening questions prior to the visit, additional usage of staff PPE, and extensive cleaning of exam room while observing appropriate contact time as indicated for disinfecting solutions.      Objective:   Physical Exam General Appearance:    Alert, cooperative, no distress, appears stated age  Head:    Normocephalic, without obvious abnormality, atraumatic  Eyes:    PERRL, conjunctiva/corneas clear, EOM's intact, fundi    benign, both  eyes       Ears:    Normal TM's and external ear canals, both ears  Nose:   Deferred due to COVID  Throat:   Neck:   Supple, symmetrical, trachea midline, no adenopathy;       thyroid:  No enlargement/tenderness/nodules  Back:     Symmetric, no curvature, ROM normal, no CVA tenderness  Lungs:     Clear to auscultation bilaterally, respirations unlabored  Chest wall:    No tenderness or deformity  Heart:    Regular rate and rhythm, S1 and S2 normal, no murmur, rub   or gallop  Abdomen:     Soft, non-tender, bowel sounds active all four quadrants,    no masses, no organomegaly  Genitalia:    Deferred  Rectal:    Extremities:   Extremities normal, atraumatic, no cyanosis or edema  Pulses:   2+ and symmetric all extremities  Skin:   Skin color, texture, turgor normal, no rashes or lesions  Lymph nodes:   Cervical, supraclavicular, and axillary nodes normal  Neurologic:   CNII-XII intact. Normal strength, sensation and reflexes      throughout          Assessment & Plan:

## 2021-10-22 NOTE — Assessment & Plan Note (Signed)
Pt reports mood is stable.  He's working.  Eating regularly.  Feels that things are moving in the right direction.

## 2021-10-22 NOTE — Patient Instructions (Signed)
Follow up in 1 year or as needed We'll notify you of your lab results and make any changes if needed Continue to work on healthy diet and regular exercise- you can do it!!! Call with any questions or concerns Stay Safe!  Stay Healthy! Happy New Year!!! 

## 2021-10-22 NOTE — Assessment & Plan Note (Signed)
BMI 29.71  Encouraged healthy diet and regular exercise.  Check labs to risk stratify.  Will follow.

## 2021-10-26 DIAGNOSIS — Z0279 Encounter for issue of other medical certificate: Secondary | ICD-10-CM

## 2021-10-27 NOTE — Telephone Encounter (Signed)
I have faxed to the # on the form and sent to scan and charge entry.

## 2021-11-17 ENCOUNTER — Other Ambulatory Visit: Payer: Self-pay

## 2021-11-17 DIAGNOSIS — F322 Major depressive disorder, single episode, severe without psychotic features: Secondary | ICD-10-CM

## 2021-11-17 DIAGNOSIS — F419 Anxiety disorder, unspecified: Secondary | ICD-10-CM

## 2021-11-17 MED ORDER — ALPRAZOLAM 0.5 MG PO TABS: ORAL_TABLET | ORAL | 0 refills | Status: DC

## 2021-11-17 MED ORDER — BUSPIRONE HCL 7.5 MG PO TABS: 7.5000 mg | ORAL_TABLET | Freq: Two times a day (BID) | ORAL | 1 refills | Status: AC

## 2022-10-24 ENCOUNTER — Encounter: Payer: Commercial Managed Care - PPO | Admitting: Family Medicine

## 2024-01-26 ENCOUNTER — Encounter: Payer: Self-pay | Admitting: Family Medicine

## 2024-01-26 ENCOUNTER — Ambulatory Visit (INDEPENDENT_AMBULATORY_CARE_PROVIDER_SITE_OTHER): Admitting: Family Medicine

## 2024-01-26 ENCOUNTER — Other Ambulatory Visit: Payer: Self-pay | Admitting: Family Medicine

## 2024-01-26 VITALS — BP 136/70 | HR 72 | Temp 97.9°F | Ht 71.0 in | Wt 211.2 lb

## 2024-01-26 DIAGNOSIS — F419 Anxiety disorder, unspecified: Secondary | ICD-10-CM

## 2024-01-26 DIAGNOSIS — Z23 Encounter for immunization: Secondary | ICD-10-CM

## 2024-01-26 DIAGNOSIS — Z0000 Encounter for general adult medical examination without abnormal findings: Secondary | ICD-10-CM

## 2024-01-26 DIAGNOSIS — E1169 Type 2 diabetes mellitus with other specified complication: Secondary | ICD-10-CM

## 2024-01-26 DIAGNOSIS — Z125 Encounter for screening for malignant neoplasm of prostate: Secondary | ICD-10-CM | POA: Diagnosis not present

## 2024-01-26 DIAGNOSIS — Z114 Encounter for screening for human immunodeficiency virus [HIV]: Secondary | ICD-10-CM

## 2024-01-26 DIAGNOSIS — Z1159 Encounter for screening for other viral diseases: Secondary | ICD-10-CM | POA: Diagnosis not present

## 2024-01-26 DIAGNOSIS — E663 Overweight: Secondary | ICD-10-CM

## 2024-01-26 LAB — LIPID PANEL
Cholesterol: 240 mg/dL — ABNORMAL HIGH (ref 0–200)
HDL: 41.6 mg/dL (ref >39.00)
LDL Cholesterol: 140 mg/dL — ABNORMAL HIGH (ref 0–99)
NonHDL: 198.13
Total CHOL/HDL Ratio: 6
Triglycerides: 290 mg/dL — ABNORMAL HIGH (ref 0.0–149.0)
VLDL: 58 mg/dL — ABNORMAL HIGH (ref 0.0–40.0)

## 2024-01-26 LAB — HEPATIC FUNCTION PANEL
ALT: 24 U/L (ref 0–53)
AST: 12 U/L (ref 0–37)
Albumin: 4.5 g/dL (ref 3.5–5.2)
Alkaline Phosphatase: 34 U/L — ABNORMAL LOW (ref 39–117)
Bilirubin, Direct: 0.1 mg/dL (ref 0.0–0.3)
Total Bilirubin: 0.6 mg/dL (ref 0.2–1.2)
Total Protein: 7.2 g/dL (ref 6.0–8.3)

## 2024-01-26 LAB — BASIC METABOLIC PANEL WITH GFR
BUN: 18 mg/dL (ref 6–23)
CO2: 28 meq/L (ref 19–32)
Calcium: 9.3 mg/dL (ref 8.4–10.5)
Chloride: 101 meq/L (ref 96–112)
Creatinine, Ser: 1.18 mg/dL (ref 0.40–1.50)
GFR: 69.92 mL/min (ref >60.00)
Glucose, Bld: 98 mg/dL (ref 70–99)
Potassium: 4.2 meq/L (ref 3.5–5.1)
Sodium: 138 meq/L (ref 135–145)

## 2024-01-26 LAB — TSH: TSH: 1.31 u[IU]/mL (ref 0.35–5.50)

## 2024-01-26 LAB — PSA: PSA: 1.63 ng/mL (ref 0.10–4.00)

## 2024-01-26 MED ORDER — FENOFIBRATE 160 MG PO TABS
160.0000 mg | ORAL_TABLET | Freq: Every day | ORAL | 0 refills | Status: DC
Start: 2024-01-26 — End: 2024-04-23

## 2024-01-26 MED ORDER — ALPRAZOLAM 0.5 MG PO TABS: ORAL_TABLET | ORAL | 0 refills | Status: AC

## 2024-01-26 MED ORDER — FLUOXETINE HCL 40 MG PO CAPS: 40.0000 mg | ORAL_CAPSULE | Freq: Every day | ORAL | 0 refills | Status: DC

## 2024-01-26 NOTE — Patient Instructions (Signed)
 Follow up in 1 year or as needed We'll notify you of your lab results and make any changes if needed Continue to work on healthy diet and regular exercise- you can do it! Call with any questions or concerns Stay Safe!  Stay Healthy! Welcome Back!!

## 2024-01-26 NOTE — Assessment & Plan Note (Signed)
 Pt's PE WNL.  UTD on colonoscopy, Tdap.  He has no concerns.  Check labs.  Anticipatory guidance provided.

## 2024-01-26 NOTE — Progress Notes (Signed)
   Subjective:    Patient ID: Kyle Nolan, male    DOB: 1971/03/25, 53 y.o.   MRN: [id number]  HPI CPE- UTD on colonoscopy, Tdap.  No concerns today  Patient Care Team    Relationship Specialty Notifications Start End  Jess Morita, MD PCP - General Family Medicine  03/02/18     Health Maintenance  Topic Date Due   HIV Screening  Never done   Hepatitis C Screening  Never done   Zoster Vaccines- Shingrix (2 of 2) 12/17/2021   COVID-19 Vaccine (4 - 2024-25 season) 05/28/2023   INFLUENZA VACCINE  04/26/2024   Colonoscopy  10/10/2026   DTaP/Tdap/Td (2 - Td or Tdap) 09/08/2029   HPV VACCINES  Aged Out   Meningococcal B Vaccine  Aged Out      Review of Systems Patient reports no vision/hearing changes, anorexia, fever ,adenopathy, persistant/recurrent hoarseness, swallowing issues, chest pain, palpitations, edema, persistant/recurrent cough, hemoptysis, dyspnea (rest,exertional, paroxysmal nocturnal), gastrointestinal  bleeding (melena, rectal bleeding), abdominal pain, excessive heart burn, GU symptoms (dysuria, hematuria, voiding/incontinence issues) syncope, focal weakness, memory loss, numbness & tingling, skin/hair/nail changes, depression, anxiety, abnormal bruising/bleeding, musculoskeletal symptoms/signs.     Objective:   Physical Exam General Appearance:    Alert, cooperative, no distress, appears stated age  Head:    Normocephalic, without obvious abnormality, atraumatic  Eyes:    PERRL, conjunctiva/corneas clear, EOM's intact both eyes       Ears:    Normal TM's and external ear canals, both ears  Nose:   Nares normal, septum midline, mucosa normal, no drainage   or sinus tenderness  Throat:   Lips, mucosa, and tongue normal; teeth and gums normal  Neck:   Supple, symmetrical, trachea midline, no adenopathy;       thyroid :  No enlargement/tenderness/nodules  Back:     Symmetric, no curvature, ROM normal, no CVA tenderness  Lungs:     Clear to auscultation  bilaterally, respirations unlabored  Chest wall:    No tenderness or deformity  Heart:    Regular rate and rhythm, S1 and S2 normal, no murmur, rub   or gallop  Abdomen:     Soft, non-tender, bowel sounds active all four quadrants,    no masses, no organomegaly  Genitalia:    deferred  Rectal:    Extremities:   Extremities normal, atraumatic, no cyanosis or edema  Pulses:   2+ and symmetric all extremities  Skin:   Skin color, texture, turgor normal, no rashes or lesions  Lymph nodes:   Cervical, supraclavicular, and axillary nodes normal  Neurologic:   CNII-XII intact. Normal strength, sensation and reflexes      throughout          Assessment & Plan:

## 2024-01-27 LAB — HEPATITIS C ANTIBODY: Hepatitis C Ab: NONREACTIVE

## 2024-01-27 LAB — HIV ANTIBODY (ROUTINE TESTING W REFLEX): HIV 1&2 Ab, 4th Generation: NONREACTIVE

## 2024-01-28 LAB — CBC WITH DIFFERENTIAL/PLATELET
Basophils Absolute: 0.1 10*3/uL (ref 0.0–0.1)
Basophils Relative: 1.4 % (ref 0.0–3.0)
Eosinophils Absolute: 0.3 10*3/uL (ref 0.0–0.7)
Eosinophils Relative: 3.8 % (ref 0.0–5.0)
HCT: 45.3 % (ref 39.0–52.0)
Hemoglobin: 15.8 g/dL (ref 13.0–17.0)
Lymphocytes Relative: 26.1 % (ref 12.0–46.0)
Lymphs Abs: 2 10*3/uL (ref 0.7–4.0)
MCHC: 34.9 g/dL (ref 30.0–36.0)
MCV: 94.9 fl (ref 78.0–100.0)
Monocytes Absolute: 0.6 10*3/uL (ref 0.1–1.0)
Monocytes Relative: 7.7 % (ref 3.0–12.0)
Neutro Abs: 4.7 10*3/uL (ref 1.4–7.7)
Neutrophils Relative %: 61 % (ref 43.0–77.0)
Platelets: 289 10*3/uL (ref 150.0–400.0)
RBC: 4.77 Mil/uL (ref 4.22–5.81)
RDW: 13 % (ref 11.5–15.5)
WBC: 7.7 10*3/uL (ref 4.0–10.5)

## 2024-01-29 ENCOUNTER — Encounter: Payer: Self-pay | Admitting: Family Medicine

## 2024-01-29 ENCOUNTER — Telehealth: Payer: Self-pay

## 2024-01-29 DIAGNOSIS — E785 Hyperlipidemia, unspecified: Secondary | ICD-10-CM

## 2024-01-29 MED ORDER — ROSUVASTATIN CALCIUM 10 MG PO TABS: 10.0000 mg | ORAL_TABLET | Freq: Every day | ORAL | 1 refills | Status: AC

## 2024-01-29 NOTE — Telephone Encounter (Signed)
-----   Message from Laymon Priest sent at 01/29/2024  7:59 AM EDT ----- Total cholesterol, triglycerides (fatty part of blood), LDL (bad cholesterol) are all elevated.  Continue Fenofibrate  daily and we are going to add Crestor 10mg  daily (#90, 1 refill) to improve your cholesterol numbers.  We will need to repeat your liver functions at a lab only visit in 6 weeks to ensure you are metabolizing the medication appropriately (LFT's, dx hyperlipidemia) and I would like to see you back in the office in 6 months rather than 1 year for follow up.  Remainder of labs look great!

## 2024-01-29 NOTE — Telephone Encounter (Signed)
 Called patient to schedule Lab only visit in 6 weeks and to schedule in 6 months with Dr.Tabori rather then a year. Left VM to return call to schedule these appointments.   Future lab placed   Medication sent to pharmacy

## 2024-01-30 NOTE — Telephone Encounter (Signed)
 See phone note

## 2024-01-31 NOTE — Telephone Encounter (Unsigned)
 Copied from CRM [id number]. Topic: Clinical - Medication Refill >> Jan 31, 2024  1:22 PM Magdalene School wrote: Medication: ALPRAZolam  (XANAX ) 0.5 MG tablet  Has the patient contacted their pharmacy? No (Agent: If no, request that the patient contact the pharmacy for the refill. If patient does not wish to contact the pharmacy document the reason why and proceed with request.) (Agent: If yes, when and what did the pharmacy advise?)  This is the patient's preferred pharmacy:  [id number] - [address] [place] [address] Phone: [phone] Fax: [phone]  Is this the correct pharmacy for this prescription? Yes If no, delete pharmacy and type the correct one.   Has the prescription been filled recently? No  Is the patient out of the medication? Yes  Has the patient been seen for an appointment in the last year OR does the patient have an upcoming appointment? Yes  Can we respond through MyChart? Yes  Agent: Please be advised that Rx refills may take up to 3 business days. We ask that you follow-up with your pharmacy.

## 2024-03-12 ENCOUNTER — Other Ambulatory Visit (INDEPENDENT_AMBULATORY_CARE_PROVIDER_SITE_OTHER)

## 2024-03-12 DIAGNOSIS — E785 Hyperlipidemia, unspecified: Secondary | ICD-10-CM | POA: Diagnosis not present

## 2024-03-14 LAB — HEPATIC FUNCTION PANEL
ALT: 22 U/L (ref 0–53)
AST: 23 U/L (ref 0–37)
Albumin: 4.3 g/dL (ref 3.5–5.2)
Alkaline Phosphatase: 24 U/L — ABNORMAL LOW (ref 39–117)
Bilirubin, Direct: 0.2 mg/dL (ref 0.0–0.3)
Total Bilirubin: 0.8 mg/dL (ref 0.2–1.2)
Total Protein: 7 g/dL (ref 6.0–8.3)

## 2024-03-15 ENCOUNTER — Ambulatory Visit: Payer: Self-pay | Admitting: Family Medicine

## 2024-04-23 ENCOUNTER — Other Ambulatory Visit: Payer: Self-pay | Admitting: Family Medicine

## 2024-04-23 DIAGNOSIS — E1169 Type 2 diabetes mellitus with other specified complication: Secondary | ICD-10-CM

## 2024-04-23 DIAGNOSIS — F419 Anxiety disorder, unspecified: Secondary | ICD-10-CM

## 2024-06-26 DEATH — deceased

## 2024-07-26 ENCOUNTER — Other Ambulatory Visit: Payer: Self-pay | Admitting: Family Medicine

## 2024-07-26 DIAGNOSIS — F419 Anxiety disorder, unspecified: Secondary | ICD-10-CM

## 2024-07-26 DIAGNOSIS — E1169 Type 2 diabetes mellitus with other specified complication: Secondary | ICD-10-CM

## 2024-08-07 ENCOUNTER — Ambulatory Visit: Payer: Self-pay | Admitting: Family Medicine
